# Patient Record
Sex: Male | Born: 1990 | Hispanic: Yes | Marital: Married | Smoking: Never smoker
Health system: Southern US, Community
[De-identification: ages and names within clinical notes are randomized; demographics above are authoritative.]

## PROBLEM LIST (undated history)

## (undated) DIAGNOSIS — J309 Allergic rhinitis, unspecified: Secondary | ICD-10-CM

## (undated) DIAGNOSIS — M2140 Flat foot [pes planus] (acquired), unspecified foot: Secondary | ICD-10-CM

## (undated) DIAGNOSIS — F32A Depression, unspecified: Secondary | ICD-10-CM

## (undated) DIAGNOSIS — G4733 Obstructive sleep apnea (adult) (pediatric): Secondary | ICD-10-CM

## (undated) DIAGNOSIS — F419 Anxiety disorder, unspecified: Secondary | ICD-10-CM

## (undated) DIAGNOSIS — F329 Major depressive disorder, single episode, unspecified: Secondary | ICD-10-CM

## (undated) HISTORY — PX: FINGER TENDON REPAIR: SHX1640

## (undated) HISTORY — DX: Depression, unspecified: F32.A

## (undated) HISTORY — PX: OTHER SURGICAL HISTORY: SHX169

## (undated) HISTORY — PX: PILONIDAL CYST EXCISION: SHX744

## (undated) HISTORY — DX: Anxiety disorder, unspecified: F41.9

---
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
## 2019-05-02 ENCOUNTER — Other Ambulatory Visit: Payer: Self-pay | Admitting: Internal Medicine

## 2019-05-02 DIAGNOSIS — Z20822 Contact with and (suspected) exposure to covid-19: Secondary | ICD-10-CM

## 2019-05-06 LAB — NOVEL CORONAVIRUS, NAA: SARS-CoV-2, NAA: NOT DETECTED

## 2019-09-17 ENCOUNTER — Other Ambulatory Visit: Payer: Self-pay

## 2019-09-17 DIAGNOSIS — Z20822 Contact with and (suspected) exposure to covid-19: Secondary | ICD-10-CM

## 2019-09-17 DIAGNOSIS — Z8616 Personal history of COVID-19: Secondary | ICD-10-CM

## 2019-09-17 HISTORY — DX: Personal history of COVID-19: Z86.16

## 2019-09-18 LAB — NOVEL CORONAVIRUS, NAA: SARS-CoV-2, NAA: DETECTED — AB

## 2019-09-19 ENCOUNTER — Encounter: Payer: Self-pay | Admitting: *Deleted

## 2020-02-07 ENCOUNTER — Other Ambulatory Visit: Payer: Self-pay

## 2020-02-07 ENCOUNTER — Ambulatory Visit (INDEPENDENT_AMBULATORY_CARE_PROVIDER_SITE_OTHER): Payer: 59 | Admitting: Medical

## 2020-02-07 ENCOUNTER — Encounter: Payer: Self-pay | Admitting: Medical

## 2020-02-07 VITALS — BP 131/79 | HR 90 | Ht 71.0 in | Wt 236.0 lb

## 2020-02-07 DIAGNOSIS — R0683 Snoring: Secondary | ICD-10-CM

## 2020-02-07 DIAGNOSIS — Z1152 Encounter for screening for COVID-19: Secondary | ICD-10-CM | POA: Diagnosis not present

## 2020-02-07 DIAGNOSIS — R5383 Other fatigue: Secondary | ICD-10-CM

## 2020-02-07 DIAGNOSIS — Z0000 Encounter for general adult medical examination without abnormal findings: Secondary | ICD-10-CM

## 2020-02-07 DIAGNOSIS — Z113 Encounter for screening for infections with a predominantly sexual mode of transmission: Secondary | ICD-10-CM

## 2020-02-07 DIAGNOSIS — Z789 Other specified health status: Secondary | ICD-10-CM

## 2020-02-07 NOTE — Patient Instructions (Addendum)
For you wellness exam today I have ordered cbc, cmp, tsh, lipid panel and hiv. Future labs to be done fasting.  Will get igg covid study since had covid in December.  Recommend exercise and healthy diet.  We will let you know lab results as they come in.  Follow up date appointment will be determined after lab review.   Will refer to pulmonologist for snoring.   For depression history let me know if you have recurrence/need meds.  Also recommend cutting back on alcohol use or spread out use. 1-2 beers when drinking as compared to binging. Better to stop completely.   Preventive Care 28-13 Years Old, Male Preventive care refers to lifestyle choices and visits with your health care provider that can promote health and wellness. This includes:  A yearly physical exam. This is also called an annual well check.  Regular dental and eye exams.  Immunizations.  Screening for certain conditions.  Healthy lifestyle choices, such as eating a healthy diet, getting regular exercise, not using drugs or products that contain nicotine and tobacco, and limiting alcohol use. What can I expect for my preventive care visit? Physical exam Your health care provider will check:  Height and weight. These may be used to calculate body mass index (BMI), which is a measurement that tells if you are at a healthy weight.  Heart rate and blood pressure.  Your skin for abnormal spots. Counseling Your health care provider may ask you questions about:  Alcohol, tobacco, and drug use.  Emotional well-being.  Home and relationship well-being.  Sexual activity.  Eating habits.  Work and work Statistician. What immunizations do I need?  Influenza (flu) vaccine  This is recommended every year. Tetanus, diphtheria, and pertussis (Tdap) vaccine  You may need a Td booster every 10 years. Varicella (chickenpox) vaccine  You may need this vaccine if you have not already been vaccinated. Human  papillomavirus (HPV) vaccine  If recommended by your health care provider, you may need three doses over 6 months. Measles, mumps, and rubella (MMR) vaccine  You may need at least one dose of MMR. You may also need a second dose. Meningococcal conjugate (MenACWY) vaccine  One dose is recommended if you are 28-56 years old and a Market researcher living in a residence hall, or if you have one of several medical conditions. You may also need additional booster doses. Pneumococcal conjugate (PCV13) vaccine  You may need this if you have certain conditions and were not previously vaccinated. Pneumococcal polysaccharide (PPSV23) vaccine  You may need one or two doses if you smoke cigarettes or if you have certain conditions. Hepatitis A vaccine  You may need this if you have certain conditions or if you travel or work in places where you may be exposed to hepatitis A. Hepatitis B vaccine  You may need this if you have certain conditions or if you travel or work in places where you may be exposed to hepatitis B. Haemophilus influenzae type b (Hib) vaccine  You may need this if you have certain risk factors. You may receive vaccines as individual doses or as more than one vaccine together in one shot (combination vaccines). Talk with your health care provider about the risks and benefits of combination vaccines. What tests do I need? Blood tests  Lipid and cholesterol levels. These may be checked every 5 years starting at age 28.  Hepatitis C test.  Hepatitis B test. Screening   Diabetes screening. This is done by  checking your blood sugar (glucose) after you have not eaten for a while (fasting).  Sexually transmitted disease (STD) testing. Talk with your health care provider about your test results, treatment options, and if necessary, the need for more tests. Follow these instructions at home: Eating and drinking   Eat a diet that includes fresh fruits and vegetables,  whole grains, lean protein, and low-fat dairy products.  Take vitamin and mineral supplements as recommended by your health care provider.  Do not drink alcohol if your health care provider tells you not to drink.  If you drink alcohol: ? Limit how much you have to 0-2 drinks a day. ? Be aware of how much alcohol is in your drink. In the [place] one drink equals one 12 oz bottle of beer (355 mL), one 5 oz glass of wine (148 mL), or one 1 oz glass of hard liquor (44 mL). Lifestyle  Take daily care of your teeth and gums.  Stay active. Exercise for at least 30 minutes on 5 or more days each week.  Do not use any products that contain nicotine or tobacco, such as cigarettes, e-cigarettes, and chewing tobacco. If you need help quitting, ask your health care provider.  If you are sexually active, practice safe sex. Use a condom or other form of protection to prevent STIs (sexually transmitted infections). What's next?  Go to your health care provider once a year for a well check visit.  Ask your health care provider how often you should have your eyes and teeth checked.  Stay up to date on all vaccines. This information is not intended to replace advice given to you by your health care provider. Make sure you discuss any questions you have with your health care provider. Document Revised: 09/20/2018 Document Reviewed: 09/20/2018 Elsevier Patient Education  2020 [employer].

## 2020-02-07 NOTE — Progress Notes (Signed)
Subjective:    Patient ID: Chad Long, male    DOB: 08/09/1991, 29 y.o.   MRN: [id number]  HPI  Pt in for first time.   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 Not exercising on regular basis. Vapes heavily. Used to chew tobacco up to 2016 the quit. 12 pack of beer on weekends. Occasional coffee. Married- one child.  No chronic medical problems.  Pt states some anxiety and depression when he was in [place]. He was given medication but did not take. He thinks may have been lexapro. Pt states mood is better now.    Review of Systems  Constitutional: Positive for fatigue. Negative for chills and fever.  HENT: Negative for congestion, ear discharge, ear pain, facial swelling, postnasal drip, sinus pressure and sore throat.   Respiratory: Negative for cough, chest tightness, shortness of breath and wheezing.        Snores since teenage. Wife concerned. Pt wants evaluation for sleep apnea.  Cardiovascular: Negative for chest pain and palpitations.  Gastrointestinal: Negative for abdominal pain.  Musculoskeletal: Negative for back pain.  Skin: Negative for rash.  Neurological: Negative for dizziness, speech difficulty, weakness, numbness and headaches.  Hematological: Negative for adenopathy. Does not bruise/bleed easily.  Psychiatric/Behavioral: Negative for behavioral problems, confusion and dysphoric mood. The patient is not nervous/anxious.        States mood is good now.    Past Medical History:  Diagnosis Date  . Anxiety   . Depression      Social History   Socioeconomic History  . Marital status: Married    Spouse name: Not on file  . Number of children: Not on file  . Years of education: Not on file  . Highest education level: Not on file  Occupational History  . Occupation: Curator.  Tobacco Use  . Smoking status: Never Smoker  . Smokeless tobacco: Former Systems developer    Types: Chew  Substance and Sexual Activity  . Alcohol use: Yes   Alcohol/week: 12.0 standard drinks    Types: 12 Cans of beer per week    Comment: over the weekend.  . Drug use: Not on file  . Sexual activity: Yes  Other Topics Concern  . Not on file  Social History Narrative  . Not on file   Social Determinants of Health   Financial Resource Strain:   . Difficulty of Paying Living Expenses:   Food Insecurity:   . Worried About Charity fundraiser in the Last Year:   . Arboriculturist in the Last Year:   Transportation Needs:   . Film/video editor (Medical):   Marland Kitchen Lack of Transportation (Non-Medical):   Physical Activity:   . Days of Exercise per Week:   . Minutes of Exercise per Session:   Stress:   . Feeling of Stress :   Social Connections:   . Frequency of Communication with Friends and Family:   . Frequency of Social Gatherings with Friends and Family:   . Attends Religious Services:   . Active Member of Clubs or Organizations:   . Attends Archivist Meetings:   Marland Kitchen Marital Status:   Intimate Partner Violence:   . Fear of Current or Ex-Partner:   . Emotionally Abused:   Marland Kitchen Physically Abused:   . Sexually Abused:     Past Surgical History:  Procedure Laterality Date  . FINGER TENDON REPAIR     left index finger in 2010.  Marland Kitchen  pilonydal cyst removal.     before he entered [employer].    History reviewed. No pertinent family history.  Not on File  No current outpatient medications on file prior to visit.   No current facility-administered medications on file prior to visit.    BP 131/79 (BP Location: Left Arm, Patient Position: Sitting, Cuff Size: Large)   Pulse 90   Ht 5\' 11"  (1.803 m)   Wt 236 lb (107 kg)   SpO2 98%   BMI 32.92 kg/m       Objective:   Physical Exam  General Mental Status- Alert. General Appearance- Not in acute distress.   Skin General: Color- Normal Color. Moisture- Normal Moisture.  Neck Carotid Arteries- Normal color. Moisture- Normal Moisture. No JVD.  Chest and Lung  Exam Auscultation: Breath Sounds:-Normal.  Cardiovascular Auscultation:Rythm- Regular. Murmurs & Other Heart Sounds:Auscultation of the heart reveals- No Murmurs.  Abdomen Inspection:-Inspeection Normal. Palpation/Percussion:Note:No mass. Palpation and Percussion of the abdomen reveal- Non Tender, Non Distended + BS, no rebound or guarding.    Neurologic Cranial Nerve exam:- CN III-XII intact(No nystagmus), symmetric smile. Drift Test:- No drift. Romberg Exam:- Negative.  Heal to Toe Gait exam:-Normal. Finger to Nose:- Normal/Intact Strength:- 5/5 equal and symmetric strength both upper and lower extremities.      Assessment & Plan:  For you wellness exam today I have ordered cbc, cmp, tsh, lipid panel and hiv. Future labs to be done fasting.  Will get igg covid study since had covid in December.  Recommend exercise and healthy diet.  We will let you know lab results as they come in.  Follow up date appointment will be determined after lab review.   Will refer to pulmonologist for snoring.   For depression history let me know if you have recurrence/need meds.  Also recommend cutting back on alcohol use or spread out use. 1-2 beers when drinking as compared to binging.     January, PA-C

## 2020-02-12 ENCOUNTER — Other Ambulatory Visit (INDEPENDENT_AMBULATORY_CARE_PROVIDER_SITE_OTHER): Payer: 59

## 2020-02-12 ENCOUNTER — Encounter: Payer: Self-pay | Admitting: Medical

## 2020-02-12 ENCOUNTER — Other Ambulatory Visit: Payer: Self-pay

## 2020-02-12 DIAGNOSIS — R5383 Other fatigue: Secondary | ICD-10-CM

## 2020-02-12 DIAGNOSIS — Z1152 Encounter for screening for COVID-19: Secondary | ICD-10-CM

## 2020-02-12 DIAGNOSIS — Z0000 Encounter for general adult medical examination without abnormal findings: Secondary | ICD-10-CM | POA: Diagnosis not present

## 2020-02-12 DIAGNOSIS — Z113 Encounter for screening for infections with a predominantly sexual mode of transmission: Secondary | ICD-10-CM

## 2020-02-12 LAB — LIPID PANEL
Cholesterol: 149 mg/dL (ref 0–200)
HDL: 43.7 mg/dL (ref >39.00)
LDL Cholesterol: 76 mg/dL (ref 0–99)
NonHDL: 105
Total CHOL/HDL Ratio: 3
Triglycerides: 145 mg/dL (ref 0.0–149.0)
VLDL: 29 mg/dL (ref 0.0–40.0)

## 2020-02-12 LAB — COMPREHENSIVE METABOLIC PANEL
ALT: 32 U/L (ref 0–53)
AST: 24 U/L (ref 0–37)
Albumin: 4.4 g/dL (ref 3.5–5.2)
Alkaline Phosphatase: 60 U/L (ref 39–117)
BUN: 19 mg/dL (ref 6–23)
CO2: 28 mEq/L (ref 19–32)
Calcium: 9.4 mg/dL (ref 8.4–10.5)
Chloride: 103 mEq/L (ref 96–112)
Creatinine, Ser: 1.09 mg/dL (ref 0.40–1.50)
GFR: 80.89 mL/min (ref >60.00)
Glucose, Bld: 96 mg/dL (ref 70–99)
Potassium: 4.2 mEq/L (ref 3.5–5.1)
Sodium: 137 mEq/L (ref 135–145)
Total Bilirubin: 0.7 mg/dL (ref 0.2–1.2)
Total Protein: 7.1 g/dL (ref 6.0–8.3)

## 2020-02-12 LAB — CBC WITH DIFFERENTIAL/PLATELET
Basophils Absolute: 0.1 10*3/uL (ref 0.0–0.1)
Basophils Relative: 0.8 % (ref 0.0–3.0)
Eosinophils Absolute: 0.5 10*3/uL (ref 0.0–0.7)
Eosinophils Relative: 7.4 % — ABNORMAL HIGH (ref 0.0–5.0)
HCT: 45.2 % (ref 39.0–52.0)
Hemoglobin: 15.7 g/dL (ref 13.0–17.0)
Lymphocytes Relative: 40.6 % (ref 12.0–46.0)
Lymphs Abs: 2.8 10*3/uL (ref 0.7–4.0)
MCHC: 34.7 g/dL (ref 30.0–36.0)
MCV: 88.2 fl (ref 78.0–100.0)
Monocytes Absolute: 0.5 10*3/uL (ref 0.1–1.0)
Monocytes Relative: 7.4 % (ref 3.0–12.0)
Neutro Abs: 3 10*3/uL (ref 1.4–7.7)
Neutrophils Relative %: 43.8 % (ref 43.0–77.0)
Platelets: 224 10*3/uL (ref 150.0–400.0)
RBC: 5.12 Mil/uL (ref 4.22–5.81)
RDW: 13.4 % (ref 11.5–15.5)
WBC: 7 10*3/uL (ref 4.0–10.5)

## 2020-02-12 LAB — TSH: TSH: 2.44 u[IU]/mL (ref 0.35–4.50)

## 2020-02-13 ENCOUNTER — Ambulatory Visit (INDEPENDENT_AMBULATORY_CARE_PROVIDER_SITE_OTHER): Payer: 59

## 2020-02-13 ENCOUNTER — Encounter: Payer: Self-pay | Admitting: Podiatry

## 2020-02-13 ENCOUNTER — Ambulatory Visit: Payer: 59 | Admitting: Podiatry

## 2020-02-13 ENCOUNTER — Other Ambulatory Visit: Payer: Self-pay | Admitting: Podiatry

## 2020-02-13 VITALS — Temp 97.0°F

## 2020-02-13 DIAGNOSIS — M79672 Pain in left foot: Secondary | ICD-10-CM | POA: Diagnosis not present

## 2020-02-13 DIAGNOSIS — M76821 Posterior tibial tendinitis, right leg: Secondary | ICD-10-CM

## 2020-02-13 DIAGNOSIS — M76822 Posterior tibial tendinitis, left leg: Secondary | ICD-10-CM

## 2020-02-13 DIAGNOSIS — M79671 Pain in right foot: Secondary | ICD-10-CM | POA: Diagnosis not present

## 2020-02-13 LAB — HIV ANTIBODY (ROUTINE TESTING W REFLEX): HIV 1&2 Ab, 4th Generation: NONREACTIVE

## 2020-02-13 MED ORDER — DICLOFENAC SODIUM 75 MG PO TBEC: 75.0000 mg | DELAYED_RELEASE_TABLET | Freq: Two times a day (BID) | ORAL | 2 refills | Status: DC

## 2020-02-13 NOTE — Progress Notes (Signed)
Subjective:   Patient ID: Chad Long, male   DOB: 29 y.o.   MRN: [id number]   HPI Patient presents with pain around the ankles of both feet and states this is been going on for years and he did have orthotics and they did not make a big difference in the past.  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  Patient does not smoke and would like to be more active   Review of Systems  All other systems reviewed and are negative.       Objective:  Physical Exam Vitals and nursing note reviewed.  Constitutional:      Appearance: He is well-developed.  Pulmonary:     Effort: Pulmonary effort is normal.  Musculoskeletal:        General: Normal range of motion.  Skin:    General: Skin is warm.  Neurological:     Mental Status: He is alert.     Neurovascular status intact muscle strength was found to be adequate with flatfoot deformity noted bilateral with mild diminishment of range of motion.  There is quite a bit of inflammation in the posterior tibial insertion into the navicular bilateral and it is painful when palpated.  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  Today I did sterile prep and I injected the posterior tibial tendon at its insertion 3 mg Dexasone Kenalog 5 mg Xylocaine and applied fascial braces to lift up the arch and placed on oral anti-inflammatories.  I discussed a change in his orthotics he will bring in his old orthotics for [place] to look at and we will decide what else we can do to try to help him.  Patient understands this is a difficult problem but I am not advocating surgery at the current time  X-rays indicate there is depression of the arch bilateral with no indications of accessory navicular or pathology from that perspective

## 2020-02-14 LAB — CERULOPLASMIN: Ceruloplasmin: 28.8 mg/dL (ref 16.0–31.0)

## 2020-02-14 LAB — HEPATITIS B SURFACE ANTIBODY,QUALITATIVE: Hep B Surface Ab, Qual: NONREACTIVE

## 2020-02-14 LAB — ANA W/REFLEX: Anti Nuclear Antibody (ANA): NEGATIVE

## 2020-02-14 LAB — HEPATITIS A (PROF V)
Hep A IgM: NEGATIVE
hep A Total Ab: NEGATIVE

## 2020-02-14 LAB — ALPHA-1-ANTITRYPSIN: A-1 Antitrypsin: 159 mg/dL (ref 95–164)

## 2020-02-14 LAB — SAR COV2 SEROLOGY (COVID19)AB(IGG),IA: DiaSorin SARS-CoV-2 Ab, IgG: POSITIVE

## 2020-02-14 LAB — MITOCHONDRIAL ANTIBODIES: Mitochondrial Ab: <20 Units (ref 0.0–20.0)

## 2020-02-14 LAB — ANTI-SMOOTH MUSCLE ANTIBODY, IGG: Smooth Muscle Ab: 10 Units (ref 0–19)

## 2020-03-05 ENCOUNTER — Ambulatory Visit: Payer: 59 | Admitting: Podiatry

## 2020-03-25 ENCOUNTER — Ambulatory Visit: Payer: 59 | Admitting: Podiatry

## 2020-05-01 ENCOUNTER — Ambulatory Visit (INDEPENDENT_AMBULATORY_CARE_PROVIDER_SITE_OTHER): Payer: 59

## 2020-05-01 ENCOUNTER — Ambulatory Visit: Payer: 59 | Admitting: Podiatry

## 2020-05-01 ENCOUNTER — Other Ambulatory Visit: Payer: Self-pay

## 2020-05-01 ENCOUNTER — Encounter: Payer: Self-pay | Admitting: Podiatry

## 2020-05-01 DIAGNOSIS — M25472 Effusion, left ankle: Secondary | ICD-10-CM

## 2020-05-01 DIAGNOSIS — M25572 Pain in left ankle and joints of left foot: Secondary | ICD-10-CM

## 2020-05-01 DIAGNOSIS — M109 Gout, unspecified: Secondary | ICD-10-CM

## 2020-05-01 MED ORDER — MELOXICAM 15 MG PO TABS
15.0000 mg | ORAL_TABLET | Freq: Every day | ORAL | 0 refills | Status: DC
Start: 2020-05-01 — End: 2020-11-17

## 2020-05-01 NOTE — Progress Notes (Signed)
Subjective:  Patient ID: Chad Long, male    DOB: 10/02/1991,  MRN: [id number]  Chief Complaint  Patient presents with  . Ankle Pain    L ankle - anterior and lateral. x2 months. Pt stated, "Pain is 7-8/10 most of the time - sometimes I'd say 10/10. No injury. Nothing helps. I've tried Biofreeze, ice, and elevation".    29 y.o. male presents with the above complaint. History confirmed with patient.  Pain is quite severe and pretty much touching it hurts.  He has very sharp pains with each step.  He previously saw Dr. Charlsie Merles for flatfeet.  He had injected his posterior tibial tendon which helped some and arch pain.  This pain is much more high in the ankle and surrounds the entirety of the ankle.  So far no treatment is helped.  Denies any history of trauma  Objective:  Physical Exam: warm, good capillary refill, no trophic changes or ulcerative lesions, normal DP and PT pulses and normal sensory exam. Left Foot: Pain and edema of the left ankle surrounding the anterior joint line, he is also tender in the posterior ankle joint and in the medial and lateral gutters.  Pain with dorsiflexion.  Pain with subtalar joint range of motion.  He has pain over the peroneals and the posterior tibial tendon as well.  No pain in the Achilles tendon.  Has full active range of motion.  No cellulitis noted.  Warmth around the ankle joint.  Radiographs: X-ray of the left ankle joint: Mild pes planus is noted, ankle joint is maintained alignment and joint space.  Large os trigonum is present posteriorly Assessment:   1. Acute gout of left ankle, unspecified cause   2. Acute left ankle pain   3. Swelling of ankle joint, left      Plan:  Patient was evaluated and treated and all questions answered.   -29 year old male with acute ankle pain slow progressive onset over the last month.  Continuously getting worse and it is painful to walk on or touch.  Symptoms potentially could be a onset of acute gout.  He  does drink alcohol and eats red meat, although he says that he has been trying to eat more salads recently.  I recommended that we diagnosis with laboratory work, and joint aspiration.  See procedure note below. -Recommend we support immobilized in a low top CAM boot -We will also order an MRI of the left ankle to evaluate the surrounding tendons and ligaments which are painful, he did not have any instability on exam today. -This could be also secondary to his flatfoot, although he was painful all around the ankle medially and posterior, and not just over the sinus tarsi.  At next visit if the injection below did not improve his pain in the ankle joint, we will consider the subtalar joint.  No evidence of tarsal coalition.  Procedure note: 2 cc of Carbocaine plain was used to anesthetize the anteromedial ankle in the area of the saphenous nerve after sterile prep with alcohol.  Once this was anesthetized, a Betadine prep was performed over the anteromedial ankle over the medial gutter, 1 cc of Carbocaine was then injected directly into the ankle joint via an 18-gauge needle on a 10 cc syringe.  Approximately 2 cc of straw-colored and bloody aspirate was removed from the left ankle joint.  This was sent for culture, cell count, and crystal analysis.  1 cc of Carbocaine and 4 mg of dexamethasone phosphate was then  injected on the left ankle.  He tolerated the procedure well.  Sharl Ma, DPM 05/01/2020     No follow-ups on file.

## 2020-05-04 ENCOUNTER — Telehealth: Payer: Self-pay | Admitting: *Deleted

## 2020-05-04 ENCOUNTER — Other Ambulatory Visit: Payer: Self-pay | Admitting: Podiatry

## 2020-05-04 DIAGNOSIS — M109 Gout, unspecified: Secondary | ICD-10-CM

## 2020-05-04 DIAGNOSIS — M76822 Posterior tibial tendinitis, left leg: Secondary | ICD-10-CM

## 2020-05-04 DIAGNOSIS — M25472 Effusion, left ankle: Secondary | ICD-10-CM

## 2020-05-04 DIAGNOSIS — M25572 Pain in left ankle and joints of left foot: Secondary | ICD-10-CM

## 2020-05-04 NOTE — Telephone Encounter (Signed)
-----   Message from Edwin Cap, DPM sent at 05/01/2020  5:22 PM EDT ----- Regarding: MRI Hey Val, can you order a non contrast MRI of the left ankle for Chad Long? Dx is acute left ankle pain, left ankle swelling. X-ray negative if that can be specified in the comments.   Thanks! Madelaine Bhat

## 2020-05-04 NOTE — Telephone Encounter (Signed)
Orders to Dr. Vara Guardian CMA for pre-cert.

## 2020-05-07 LAB — SYNOVIAL CELL COUNT + DIFF, W/ CRYSTALS
Basophils, %: 0 %
Eosinophils-Synovial: 0 % (ref 0–2)
Lymphocytes-Synovial Fld: 82 % — ABNORMAL HIGH (ref 0–74)
Monocyte/Macrophage: 12 % (ref 0–69)
Neutrophil, Synovial: 6 % (ref 0–24)
Synoviocytes, %: 0 % (ref 0–15)
WBC, Synovial: 215 cells/uL — ABNORMAL HIGH (ref <150)

## 2020-05-07 LAB — ANAEROBIC AND AEROBIC CULTURE
AER RESULT:: NO GROWTH
MICRO NUMBER:: 10743640
MICRO NUMBER:: 10743641
SPECIMEN QUALITY:: ADEQUATE
SPECIMEN QUALITY:: ADEQUATE

## 2020-05-13 ENCOUNTER — Telehealth: Payer: Self-pay | Admitting: *Deleted

## 2020-05-13 NOTE — Telephone Encounter (Signed)
Faxed patient's information to [facility] to get a prior authorization done on 05/13/20

## 2020-05-15 ENCOUNTER — Other Ambulatory Visit: Payer: Self-pay

## 2020-05-15 ENCOUNTER — Ambulatory Visit: Payer: 59

## 2020-05-15 DIAGNOSIS — Z20822 Contact with and (suspected) exposure to covid-19: Secondary | ICD-10-CM

## 2020-05-16 LAB — SARS-COV-2, NAA 2 DAY TAT

## 2020-05-16 LAB — NOVEL CORONAVIRUS, NAA: SARS-CoV-2, NAA: NOT DETECTED

## 2020-05-22 ENCOUNTER — Other Ambulatory Visit: Payer: Self-pay

## 2020-05-22 ENCOUNTER — Ambulatory Visit (INDEPENDENT_AMBULATORY_CARE_PROVIDER_SITE_OTHER): Payer: 59 | Admitting: Podiatry

## 2020-05-22 DIAGNOSIS — M25472 Effusion, left ankle: Secondary | ICD-10-CM

## 2020-05-22 DIAGNOSIS — M25572 Pain in left ankle and joints of left foot: Secondary | ICD-10-CM | POA: Diagnosis not present

## 2020-05-22 DIAGNOSIS — M76821 Posterior tibial tendinitis, right leg: Secondary | ICD-10-CM

## 2020-05-22 DIAGNOSIS — M79672 Pain in left foot: Secondary | ICD-10-CM

## 2020-05-22 DIAGNOSIS — M79671 Pain in right foot: Secondary | ICD-10-CM

## 2020-05-22 DIAGNOSIS — M2141 Flat foot [pes planus] (acquired), right foot: Secondary | ICD-10-CM

## 2020-05-22 DIAGNOSIS — M2142 Flat foot [pes planus] (acquired), left foot: Secondary | ICD-10-CM

## 2020-05-22 DIAGNOSIS — M76822 Posterior tibial tendinitis, left leg: Secondary | ICD-10-CM

## 2020-05-24 NOTE — Progress Notes (Signed)
  Subjective:  Patient ID: Chad Long, male    DOB: 1991/01/30,  MRN: [id number]  Chief Complaint  Patient presents with  . Follow-up    L ankle. Pt stated, "It's getting better. I start having pain when I walk for awhile. I have not received a call regarding the MRI". Pt was unable to rate his pain using the pain scale.     29 y.o. male presents with the above complaint. History confirmed with patient. Pain in the ankle is better than it was after the injection. He still has pain on the outside of the foot near the distal fibula. Did not have the lab work completed and has not had the MRI yet.  Objective:  Physical Exam: warm, good capillary refill, no trophic changes or ulcerative lesions, normal DP and PT pulses and normal sensory exam. Left Foot: Pain with ankle joint range of motion is much improved and no longer has pain on the medial joint line. He still has pain with subtalar joint range of motion. Pain with palpation of the sinus tarsi. He has pain over the peroneals and the posterior tibial tendon as well.  No pain in the Achilles tendon.  Has full active range of motion.      Ankle aspirate from last visit is negative for gout, pseudogout or septic arthritis Assessment:   1. Acute left ankle pain   2. Swelling of ankle joint, left   3. Pes planus of both feet   4. Posterior tibialis tendinitis of both lower extremities   5. Sinus tarsi syndrome, left   6. Bilateral foot pain      Plan:  Patient was evaluated and treated and all questions answered.   - today his pain somewhat improved, however he does still have pain in the subtalar joint and the medial and lateral foot. His ankle joint tap last visit was negative for gout, pseudogout or septic arthritis. It appears now the most of his pathology is centered around the subtalar joint. I think this is all secondary to his pes planus deformity at this point. I recommended a diagnostic and therapeutic block/injection of the  subtalar joint today. -Reviewed supportive shoe gears including orthotic support. -MRI is now scheduled for later this month -Pending MRI results will discuss further with him surgical and nonsurgical treatment for flatfoot deformity and pain.   Procedure: Injection Intermediate Joint Consent: Verbal consent obtained. Location: Left subtalar joint  Skin Prep: Betadine and alcohol. Injectate: 1 cc 2% Xylocaine plain, 1 cc dexamethasone phosphate Disposition: Patient tolerated procedure well. Injection site dressed with a band-aid.   Return in about 1 month (around 06/22/2020).

## 2020-05-30 ENCOUNTER — Other Ambulatory Visit: Payer: Self-pay

## 2020-05-30 ENCOUNTER — Ambulatory Visit (HOSPITAL_BASED_OUTPATIENT_CLINIC_OR_DEPARTMENT_OTHER)
Admission: RE | Admit: 2020-05-30 | Discharge: 2020-05-30 | Disposition: A | Discharge status: Home / Self Care | Payer: 59 | Source: Ambulatory Visit | Attending: Podiatry | Admitting: Podiatry

## 2020-05-30 DIAGNOSIS — M76822 Posterior tibial tendinitis, left leg: Secondary | ICD-10-CM | POA: Diagnosis present

## 2020-05-30 DIAGNOSIS — M109 Gout, unspecified: Secondary | ICD-10-CM | POA: Diagnosis present

## 2020-05-30 DIAGNOSIS — M25572 Pain in left ankle and joints of left foot: Secondary | ICD-10-CM | POA: Insufficient documentation

## 2020-05-30 DIAGNOSIS — M25472 Effusion, left ankle: Secondary | ICD-10-CM | POA: Diagnosis present

## 2020-05-30 DIAGNOSIS — M76821 Posterior tibial tendinitis, right leg: Secondary | ICD-10-CM | POA: Diagnosis present

## 2020-06-25 ENCOUNTER — Ambulatory Visit (INDEPENDENT_AMBULATORY_CARE_PROVIDER_SITE_OTHER): Payer: 59 | Admitting: Podiatry

## 2020-06-25 ENCOUNTER — Encounter: Payer: Self-pay | Admitting: Podiatry

## 2020-06-25 ENCOUNTER — Other Ambulatory Visit: Payer: Self-pay

## 2020-06-25 DIAGNOSIS — M87076 Idiopathic aseptic necrosis of unspecified foot: Secondary | ICD-10-CM

## 2020-06-25 DIAGNOSIS — M2142 Flat foot [pes planus] (acquired), left foot: Secondary | ICD-10-CM

## 2020-06-25 DIAGNOSIS — R937 Abnormal findings on diagnostic imaging of other parts of musculoskeletal system: Secondary | ICD-10-CM

## 2020-06-25 DIAGNOSIS — M25572 Pain in left ankle and joints of left foot: Secondary | ICD-10-CM | POA: Diagnosis not present

## 2020-06-25 NOTE — Progress Notes (Signed)
Subjective:  Patient ID: Chad Long, male    DOB: 25-Aug-1991,  MRN: [id number]  Chief Complaint  Patient presents with  . Foot Pain     left ankle pain follow up    29 y.o. male returns with the above complaint. History confirmed with patient. P he completed his MRI and is here for review.  The pain is similar to last visit and did not help after the last injection. Objective:  Physical Exam: warm, good capillary refill, no trophic changes or ulcerative lesions, normal DP and PT pulses and normal sensory exam.  Left Foot: He still has pain with subtalar joint range of motion. Pain with palpation of the sinus tarsi. He has pain over the peroneals and the posterior tibial tendon as well.  No pain in the Achilles tendon.  Has full active range of motion.    MRI left Foot and Ankle 05/30/20: Study Result  Narrative & Impression  CLINICAL DATA:  Diffuse left ankle pain, swelling, limited range of motion and difficulty weight-bearing for 3 months. No known injury.  EXAM: MRI OF THE LEFT ANKLE WITHOUT CONTRAST  TECHNIQUE: Multiplanar, multisequence MR imaging of the ankle was performed. No intravenous contrast was administered.  COMPARISON:  Plain films left ankle 05/01/2020. Plain films left foot 02/13/2020.  FINDINGS: TENDONS  Peroneal: Intact.  Posteromedial: Intact.  Anterior: Intact.  Achilles: Intact.  Plantar Fascia: Normal.  LIGAMENTS  Lateral: Intact.  Medial: Intact.  CARTILAGE  Ankle Joint: No osteochondral lesion of the talar dome or joint effusion.  Subtalar Joints/Sinus Tarsi: There is marked edema throughout the sinus tarsi with obliteration of normal fat signal. Edema and cystic change are seen in both the roof and floor of the sinus tarsi. Ligaments within the sinus tarsi appear intact.  Bones: As described above. No fracture or stress change is identified. No tarsal coalition is seen.  Other:  None.  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 The exam is otherwise negative.   Electronically Signed   By: Drusilla Kanner M.D.   On: 05/30/2020 17:55    Assessment:   1. Bone marrow edema   2. Sinus tarsi syndrome of left ankle   3. Acquired pes planus, left   4. 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   Reviewed the MRI images in detail with the patient.  Discussed with him that he has a sinus tarsi syndrome as well as early degenerative changes in the subtalar joint with bone marrow edema and potential cystic changes.  This began approximately 4 months ago in the absence of traumatic injury.  I believe most of the changes are secondary to his pes planus.  I recommended that he stay off the foot as much as possible and be immobilized in a CAM boot.    I believe he could benefit from bone marrow stimulation to improve his symptoms as well and prevent avascular osteonecrosis of the talar neck and body and eventual subtalar joint arthritis now that he has failed nonsurgical treatment for almost 4 months now (he first underwent subtalar joint injection in early May of this year).    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  We discussed the potential complications can arise if this continues to worsen for him including need for subtalar joint arthrodesis.  Return in about 6 weeks (around 08/06/2020).

## 2020-06-25 NOTE — Patient Instructions (Signed)
Wear the boot at all times when walking. Stay off the left foot as much as possible.

## 2020-08-06 ENCOUNTER — Ambulatory Visit (INDEPENDENT_AMBULATORY_CARE_PROVIDER_SITE_OTHER): Payer: 59

## 2020-08-06 ENCOUNTER — Ambulatory Visit (INDEPENDENT_AMBULATORY_CARE_PROVIDER_SITE_OTHER): Payer: 59 | Admitting: Podiatry

## 2020-08-06 ENCOUNTER — Other Ambulatory Visit: Payer: Self-pay

## 2020-08-06 DIAGNOSIS — M25572 Pain in left ankle and joints of left foot: Secondary | ICD-10-CM

## 2020-08-06 DIAGNOSIS — M2141 Flat foot [pes planus] (acquired), right foot: Secondary | ICD-10-CM | POA: Diagnosis not present

## 2020-08-06 DIAGNOSIS — M25571 Pain in right ankle and joints of right foot: Secondary | ICD-10-CM

## 2020-08-06 DIAGNOSIS — Q6689 Other  specified congenital deformities of feet: Secondary | ICD-10-CM

## 2020-08-06 DIAGNOSIS — M19072 Primary osteoarthritis, left ankle and foot: Secondary | ICD-10-CM

## 2020-08-06 DIAGNOSIS — M2142 Flat foot [pes planus] (acquired), left foot: Secondary | ICD-10-CM

## 2020-08-09 ENCOUNTER — Encounter: Payer: Self-pay | Admitting: Podiatry

## 2020-08-09 NOTE — Progress Notes (Signed)
  Subjective:  Patient ID: Chad Long, male    DOB: Jan 17, 1991,  MRN: [id number]  Chief Complaint  Patient presents with  . Foot Pain    PT stated that he has no concerns and that wearing the boot made his foot hurt more     29 y.o. male returns with the above complaint. History confirmed with patient.  He continues to have hindfoot pain.  Objective:  Physical Exam: warm, good capillary refill, no trophic changes or ulcerative lesions, normal DP and PT pulses and normal sensory exam.  Left Foot: He still has pain with subtalar joint range of motion, this is very limited on exam today. Pain with palpation of the sinus tarsi. He has pain over the peroneals and the posterior tibial tendon as well.  No pain in the Achilles tendon.     MRI left Foot and Ankle 05/30/20: Study Result  Narrative & Impression  CLINICAL DATA:  Diffuse left ankle pain, swelling, limited range of motion and difficulty weight-bearing for 3 months. No known injury.  EXAM: MRI OF THE LEFT ANKLE WITHOUT CONTRAST  TECHNIQUE: Multiplanar, multisequence MR imaging of the ankle was performed. No intravenous contrast was administered.  COMPARISON:  Plain films left ankle 05/01/2020. Plain films left foot 02/13/2020.  FINDINGS: TENDONS  Peroneal: Intact.  Posteromedial: Intact.  Anterior: Intact.  Achilles: Intact.  Plantar Fascia: Normal.  LIGAMENTS  Lateral: Intact.  Medial: Intact.  CARTILAGE  Ankle Joint: No osteochondral lesion of the talar dome or joint effusion.  Subtalar Joints/Sinus Tarsi: There is marked edema throughout the sinus tarsi with obliteration of normal fat signal. Edema and cystic change are seen in both the roof and floor of the sinus tarsi. Ligaments within the sinus tarsi appear intact.  Bones: As described above. No fracture or stress change is identified. No tarsal coalition is seen.  Other: None.  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 The exam is otherwise negative.   Electronically Signed   By: Drusilla Kanner M.D.   On: 05/30/2020 17:55   Calcaneal axial views of both feet were taken, 3 series 10 degrees apart each, no definite middle facet coalition is noted Assessment:   1. Acute bilateral ankle pain   2. Arthritis of left subtalar joint   3. Pes planus of both feet   4. Coalition, talocalcaneal      Plan:  Patient was evaluated and treated and all questions answered.   We again reviewed his MRI and symptoms clinically.  He has clinical findings and symptoms of a coalition.  He has early arthrosis of the subtalar joint, which would be uncommon for someone of his age.  I discussed this case with the reading radiologist Dr. Drusilla Kanner in the coronal views that I thought were concerning for middle facet coalition he believes may be due to angular artifact due to the slope of the middle facet.  Given the amount of arthrosis he has in his subtalar joint as well as final rule out of any tarsal coalition, I recommended we obtain a CT of the left lower extremity in order to guide surgical planning.  I believe he will need surgical correction of his flatfoot and any residual arthritis.   Return in about 1 month (around 09/06/2020).

## 2020-08-10 ENCOUNTER — Other Ambulatory Visit: Payer: Self-pay | Admitting: Podiatry

## 2020-08-10 DIAGNOSIS — Q6689 Other  specified congenital deformities of feet: Secondary | ICD-10-CM

## 2020-08-17 ENCOUNTER — Telehealth: Payer: Self-pay | Admitting: Podiatry

## 2020-08-17 DIAGNOSIS — M2141 Flat foot [pes planus] (acquired), right foot: Secondary | ICD-10-CM

## 2020-08-17 DIAGNOSIS — Q6689 Other  specified congenital deformities of feet: Secondary | ICD-10-CM

## 2020-08-17 DIAGNOSIS — M19072 Primary osteoarthritis, left ankle and foot: Secondary | ICD-10-CM

## 2020-08-17 DIAGNOSIS — M2142 Flat foot [pes planus] (acquired), left foot: Secondary | ICD-10-CM

## 2020-08-17 NOTE — Telephone Encounter (Signed)
[facility] called and stated that the order for  CT lower left extremity needs to be resent. It has to be a specific body part. Please resent order for patient

## 2020-08-17 NOTE — Addendum Note (Signed)
Addended byLilian Kapur, Sheriden Archibeque R on: 08/17/2020 12:37 PM   Modules accepted: Orders

## 2020-08-28 ENCOUNTER — Ambulatory Visit: Payer: 59 | Admitting: Podiatry

## 2020-09-02 ENCOUNTER — Ambulatory Visit
Admission: RE | Admit: 2020-09-02 | Discharge: 2020-09-02 | Disposition: A | Discharge status: Home / Self Care | Payer: 59 | Source: Ambulatory Visit | Attending: Podiatry | Admitting: Podiatry

## 2020-09-02 DIAGNOSIS — M19072 Primary osteoarthritis, left ankle and foot: Secondary | ICD-10-CM

## 2020-09-02 DIAGNOSIS — Q6689 Other  specified congenital deformities of feet: Secondary | ICD-10-CM

## 2020-09-02 DIAGNOSIS — M2141 Flat foot [pes planus] (acquired), right foot: Secondary | ICD-10-CM

## 2020-09-11 ENCOUNTER — Other Ambulatory Visit: Payer: Self-pay

## 2020-09-11 ENCOUNTER — Ambulatory Visit (INDEPENDENT_AMBULATORY_CARE_PROVIDER_SITE_OTHER): Payer: 59 | Admitting: Podiatry

## 2020-09-11 DIAGNOSIS — M76822 Posterior tibial tendinitis, left leg: Secondary | ICD-10-CM

## 2020-09-11 DIAGNOSIS — M2142 Flat foot [pes planus] (acquired), left foot: Secondary | ICD-10-CM

## 2020-09-11 DIAGNOSIS — M25572 Pain in left ankle and joints of left foot: Secondary | ICD-10-CM

## 2020-09-11 DIAGNOSIS — M76821 Posterior tibial tendinitis, right leg: Secondary | ICD-10-CM | POA: Diagnosis not present

## 2020-09-11 DIAGNOSIS — M62462 Contracture of muscle, left lower leg: Secondary | ICD-10-CM

## 2020-09-11 DIAGNOSIS — M216X2 Other acquired deformities of left foot: Secondary | ICD-10-CM | POA: Diagnosis not present

## 2020-09-11 DIAGNOSIS — M21862 Other specified acquired deformities of left lower leg: Secondary | ICD-10-CM

## 2020-09-15 NOTE — Progress Notes (Signed)
Subjective:  Patient ID: Chad Long, male    DOB: 07-01-1991,  MRN: [id number]  Chief Complaint  Patient presents with  . Foot Pain    Pt stated that he is doing ok he has no concerns but does have some foot pain and is here for CT scan results     29 y.o. male returns with the above complaint. History confirmed with patient.  Completed his CT scan  Objective:  Physical Exam: warm, good capillary refill, no trophic changes or ulcerative lesions, normal DP and PT pulses and normal sensory exam.  Left Foot:. Pain with palpation of the sinus tarsi. He has pain over the peroneals and the posterior tibial tendon as well.  No pain in the Achilles tendon.   He has pain in the mid arch.  Pes planus noted.  He is able to do a single heel rise.  CT 09/02/2020: Study Result  Narrative & Impression  CLINICAL DATA:  Chronic left foot pain  EXAM: CT OF THE LEFT FOOT WITHOUT CONTRAST  TECHNIQUE: Multidetector CT imaging of the left foot was performed according to the standard protocol. Multiplanar CT image reconstructions were also generated.  COMPARISON:  X-ray 05/01/2020.  MRI 05/30/2020  FINDINGS: Bones/Joint/Cartilage  No acute fracture. No dislocation. Ankle mortise is congruent. No talar dome osteochondral lesion. Subtalar joint spaces are maintained. Subcortical cystic changes adjacent to the middle subtalar joint. Prominent os trigonum. No tibiotalar or subtalar joint effusion is seen. Loss of fat density within the region of the sinus tarsi as previously characterized on MRI. No evidence of bony tarsal coalition.  Joint spaces of the midfoot and forefoot are maintained without significant arthropathy. No cortical thickening or periostitis.  Ligaments  Suboptimally assessed by CT.  Muscles and Tendons  Normal appearance of the tendinous structures by CT. No muscle atrophy or fatty infiltration.  Soft tissues  No soft tissue edema or fluid  collection.  IMPRESSION: 1. No acute osseous abnormality or significant degenerative changes of the left foot. 2. No evidence of tarsal coalition. 3. Loss of fat density within the region of the sinus tarsi as previously characterized on MRI. 4. Os trigonum.   Electronically Signed   By: Duanne Guess D.O.   On: 09/03/2020 13:37    MRI left Foot and Ankle 05/30/20: Study Result  Narrative & Impression  CLINICAL DATA:  Diffuse left ankle pain, swelling, limited range of motion and difficulty weight-bearing for 3 months. No known injury.  EXAM: MRI OF THE LEFT ANKLE WITHOUT CONTRAST  TECHNIQUE: Multiplanar, multisequence MR imaging of the ankle was performed. No intravenous contrast was administered.  COMPARISON:  Plain films left ankle 05/01/2020. Plain films left foot 02/13/2020.  FINDINGS: TENDONS  Peroneal: Intact.  Posteromedial: Intact.  Anterior: Intact.  Achilles: Intact.  Plantar Fascia: Normal.  LIGAMENTS  Lateral: Intact.  Medial: Intact.  CARTILAGE  Ankle Joint: No osteochondral lesion of the talar dome or joint effusion.  Subtalar Joints/Sinus Tarsi: There is marked edema throughout the sinus tarsi with obliteration of normal fat signal. Edema and cystic change are seen in both the roof and floor of the sinus tarsi. Ligaments within the sinus tarsi appear intact.  Bones: As described above. No fracture or stress change is identified. No tarsal coalition is seen.  Other: None.  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 The exam is otherwise negative.   Electronically Signed   By: Drusilla Kanner M.D.   On: 05/30/2020 17:55   Calcaneal axial views of  both feet were taken, 3 series 10 degrees apart each, no definite middle facet coalition is noted Assessment:   No diagnosis found.   Plan:  Patient was evaluated and treated and all questions answered.   Discussed with him  today that he does not have any evidence radiographically of any tarsal coalition's.  He has not improved with injections and over long periods of time, although they were helpful for a short period of time.  He has tried orthotics previously both prefabricated and custom.  These were not helpful for him.  He is interested in surgical correction.  Discussed the risk, benefits and potential complication of surgical correction of flatfoot deformity with him.  Surgically we discussed the following procedures: Strayer gastrocnemius recession, Evans calcaneal osteotomy, medial displacement calcaneal osteotomy, and a cotton osteotomy.  These to be with the use of cadaveric bone graft and metallic implants as needed.  We discussed the postoperative course and the need for non-weightbearing for up to 8 weeks.  He would like to proceed with surgical correction.  All his questions were addressed.  Informed consent was signed and reviewed.  Surgery will be scheduled at a mutually agreeable date   Surgical plan:  Procedure: -Strayer gastrocnemius recession, Evans calcaneal osteotomy, cotton osteotomy, medial displacement calcaneal osteotomy left foot and leg  Location: -[facility]  Anesthesia plan: -General anesthesia with regional block  Postoperative pain plan: - Tylenol 1000 mg every 6 hours, ibuprofen 600 mg every 6 hours, gabapentin 300 mg every 8 hours x5 days, oxycodone 5 mg 1-2 tabs every 6 hours only as needed  DVT prophylaxis: -ASA 325 mg twice daily  WB Restrictions / DME needs: -NWB in posterior splint LLE    No follow-ups on file.

## 2020-10-07 ENCOUNTER — Other Ambulatory Visit: Payer: Self-pay

## 2020-10-07 ENCOUNTER — Other Ambulatory Visit: Payer: 59

## 2020-10-07 DIAGNOSIS — Z20822 Contact with and (suspected) exposure to covid-19: Secondary | ICD-10-CM

## 2020-10-09 LAB — NOVEL CORONAVIRUS, NAA: SARS-CoV-2, NAA: NOT DETECTED

## 2020-10-09 LAB — SARS-COV-2, NAA 2 DAY TAT

## 2020-11-11 ENCOUNTER — Telehealth: Payer: Self-pay

## 2020-11-11 NOTE — Telephone Encounter (Signed)
DOS 11/20/2020  REVISION CALF TENDON LT - 90383 INCISION OF MIDFOOT BONES LT - 28304 INCISION OF HEEL BONE LT - 28300  RECEIVED FAX FROM [facility] - NO AUTH REQUIRED FOR CPT [id number], [phone] [place] [phone]. REF # [id number]

## 2020-11-16 ENCOUNTER — Ambulatory Visit: Payer: 59 | Admitting: Medical

## 2020-11-17 ENCOUNTER — Other Ambulatory Visit: Payer: Self-pay

## 2020-11-17 ENCOUNTER — Encounter (HOSPITAL_BASED_OUTPATIENT_CLINIC_OR_DEPARTMENT_OTHER): Payer: Self-pay | Admitting: Podiatry

## 2020-11-17 ENCOUNTER — Ambulatory Visit: Payer: 59 | Admitting: Medical

## 2020-11-18 ENCOUNTER — Encounter: Payer: Self-pay | Admitting: Medical

## 2020-11-18 ENCOUNTER — Ambulatory Visit (INDEPENDENT_AMBULATORY_CARE_PROVIDER_SITE_OTHER): Payer: 59 | Admitting: Medical

## 2020-11-18 VITALS — BP 139/89 | HR 93 | Temp 98.1°F | Resp 18 | Ht 71.0 in | Wt 225.0 lb

## 2020-11-18 DIAGNOSIS — M2142 Flat foot [pes planus] (acquired), left foot: Secondary | ICD-10-CM

## 2020-11-18 DIAGNOSIS — M2141 Flat foot [pes planus] (acquired), right foot: Secondary | ICD-10-CM | POA: Diagnosis not present

## 2020-11-18 DIAGNOSIS — Z01818 Encounter for other preprocedural examination: Secondary | ICD-10-CM

## 2020-11-18 LAB — COMPREHENSIVE METABOLIC PANEL
AG Ratio: 1.4 (calc) (ref 1.0–2.5)
ALT: 25 U/L (ref 9–46)
AST: 22 U/L (ref 10–40)
Albumin: 4.4 g/dL (ref 3.6–5.1)
Alkaline phosphatase (APISO): 79 U/L (ref 36–130)
BUN: 12 mg/dL (ref 7–25)
CO2: 28 mmol/L (ref 20–32)
Calcium: 9.7 mg/dL (ref 8.6–10.3)
Chloride: 105 mmol/L (ref 98–110)
Creat: 1.14 mg/dL (ref 0.60–1.35)
Globulin: 3.1 g/dL (calc) (ref 1.9–3.7)
Glucose, Bld: 93 mg/dL (ref 65–99)
Potassium: 4.1 mmol/L (ref 3.5–5.3)
Sodium: 140 mmol/L (ref 135–146)
Total Bilirubin: 0.5 mg/dL (ref 0.2–1.2)
Total Protein: 7.5 g/dL (ref 6.1–8.1)

## 2020-11-18 LAB — CBC WITH DIFFERENTIAL/PLATELET
Absolute Monocytes: 504 cells/uL (ref 200–950)
Basophils Absolute: 50 cells/uL (ref 0–200)
Basophils Relative: 0.7 %
Eosinophils Absolute: 959 cells/uL — ABNORMAL HIGH (ref 15–500)
Eosinophils Relative: 13.5 %
HCT: 45.9 % (ref 38.5–50.0)
Hemoglobin: 15.7 g/dL (ref 13.2–17.1)
Lymphs Abs: 2528 cells/uL (ref 850–3900)
MCH: 30 pg (ref 27.0–33.0)
MCHC: 34.2 g/dL (ref 32.0–36.0)
MCV: 87.6 fL (ref 80.0–100.0)
MPV: 9.4 fL (ref 7.5–12.5)
Monocytes Relative: 7.1 %
Neutro Abs: 3060 cells/uL (ref 1500–7800)
Neutrophils Relative %: 43.1 %
Platelets: 247 10*3/uL (ref 140–400)
RBC: 5.24 10*6/uL (ref 4.20–5.80)
RDW: 13 % (ref 11.0–15.0)
Total Lymphocyte: 35.6 %
WBC: 7.1 10*3/uL (ref 3.8–10.8)

## 2020-11-18 NOTE — Progress Notes (Signed)
Subjective:    Patient ID: Chad Long, male    DOB: 04-19-1991, 30 y.o.   MRN: [id number]  HPI Pt has hx of flat feet. Pt states feet always bothered him. He states when in [employer] PT feet would hurt.  Pt is scheduled to have surgery for foot reconstruction.   No family history of sudden death. General anesthesia twice with no adverse reaction. Tendon laceration and large cyst in upper buttock. No cardiac or respiratory condition. No easy bruising.   FH- Dad- diabetes. Mom- alive and well. 1 brother and sister alive and well.       Review of Systems  Constitutional: Negative for chills, fatigue and fever.  HENT: Negative for congestion.   Respiratory: Negative for cough, chest tightness, shortness of breath and wheezing.   Cardiovascular: Negative for chest pain and palpitations.  Gastrointestinal: Negative for abdominal pain, blood in stool, diarrhea and vomiting.  Genitourinary: Negative for difficulty urinating, flank pain and frequency.  Musculoskeletal: Negative for back pain.  Skin: Positive for rash. Negative for pallor.  Neurological: Negative for dizziness, speech difficulty, weakness, numbness and headaches.  Hematological: Negative for adenopathy. Does not bruise/bleed easily.  Psychiatric/Behavioral: Negative for behavioral problems and confusion. The patient is not nervous/anxious.    Past Medical History:  Diagnosis Date  . Anxiety   . Depression   . Flat foot   . History of 2019 novel coronavirus disease (COVID-19) 09/17/2019   positive test result in epic, per pt had mild symptoms that resolved  . OSA (obstructive sleep apnea)    followed by pulmonologist---Llindsey Tyron Russell NP---  pt study in epic 06-22-2020 mild osa and pt titrated for cpap,  on 11-17-2020 pt stated his cpap is on order and have not received yet     Social History   Socioeconomic History  . Marital status: Married    Spouse name: Not on file  . Number of children: Not on file  .  Years of education: Not on file  . Highest education level: Not on file  Occupational History  . Occupation: Education administrator.  Tobacco Use  . Smoking status: Never Smoker  . Smokeless tobacco: Former Neurosurgeon    Types: Engineer, drilling  . Vaping Use: Every day  . Start date: 02/07/2015  . Devices: Vuse  Substance and Sexual Activity  . Alcohol use: Yes    Alcohol/week: 12.0 standard drinks    Types: 12 Cans of beer per week  . Drug use: Not Currently    Comment: 11-17-2020  per pt last used teen  . Sexual activity: Yes  Other Topics Concern  . Not on file  Social History Narrative  . Not on file   Social Determinants of Health   Financial Resource Strain: Not on file  Food Insecurity: Not on file  Transportation Needs: Not on file  Physical Activity: Not on file  Stress: Not on file  Social Connections: Not on file  Intimate Partner Violence: Not on file    Past Surgical History:  Procedure Laterality Date  . FINGER TENDON REPAIR Left 2010   index tendon repair from laceration  . PILONIDAL CYST EXCISION  09-19-2013  @ [facility]    Family History  Problem Relation Age of Onset  . Diabetes Father   . Diabetes Paternal Aunt   . Diabetes Paternal Grandmother     No Known Allergies  No current outpatient medications on file prior to visit.   No current facility-administered medications on file prior  to visit.    BP 139/89   Pulse 93   Temp 98.1 F (36.7 C) (Oral)   Resp 18   Ht 5\' 11"  (1.803 m)   Wt 225 lb (102.1 kg)   SpO2 96%   BMI 31.38 kg/m       Objective:   Physical Exam  General Mental Status- Alert. General Appearance- Not in acute distress.   Skin General: Color- Normal Color. Moisture- Normal Moisture.  Neck Carotid Arteries- Normal color. Moisture- Normal Moisture. No carotid bruits. No JVD.  Chest and Lung Exam Auscultation: Breath Sounds:-Normal.  Cardiovascular Auscultation:Rythm- Regular. Murmurs & Other Heart Sounds:Auscultation of the  heart reveals- No Murmurs.  Abdomen Inspection:-Inspeection Normal. Palpation/Percussion:Note:No mass. Palpation and Percussion of the abdomen reveal- Non Tender, Non Distended + BS, no rebound or guarding.    Neurologic Cranial Nerve exam:- CN III-XII intact(No nystagmus), symmetric smile. Strength:- 5/5 equal and symmetric strength both upper and lower extremities.      Assessment & Plan:  Your physical exam was normal except mild elevated  Blood pressure. In past blood pressure has been better. Elevation may be due to visit and upcoming surgery. EKG sinus rhythm.  Recommend low salt diet, exercise when healed from foot surgeries and keep weight under control. Follow bp in future.  Cbc and cmp today. Will fax form tomorrow for clearance after review lab.  Follow up in 4-6 weeks for bp check.  , PA-C

## 2020-11-18 NOTE — Patient Instructions (Addendum)
Your physical exam was normal except mild elevated  Blood pressure. In past blood pressure has been better. Elevation may be due to visit and upcoming surgery. EKG sinus rhythm.  Recommend low salt diet, exercise when healed from foot surgeries and keep weight under control. Follow bp in future.  Cbc and cmp today. Will fax form tomorrow for clearance after review lab.  Follow up in 4-6 weeks for bp check.

## 2020-11-19 ENCOUNTER — Telehealth: Payer: Self-pay | Admitting: Medical

## 2020-11-19 ENCOUNTER — Other Ambulatory Visit (HOSPITAL_COMMUNITY)
Admission: RE | Admit: 2020-11-19 | Discharge: 2020-11-19 | Disposition: A | Discharge status: Home / Self Care | Payer: 59 | Source: Ambulatory Visit | Attending: Podiatry | Admitting: Podiatry

## 2020-11-19 DIAGNOSIS — Z20822 Contact with and (suspected) exposure to covid-19: Secondary | ICD-10-CM | POA: Insufficient documentation

## 2020-11-19 DIAGNOSIS — Z01812 Encounter for preprocedural laboratory examination: Secondary | ICD-10-CM | POA: Insufficient documentation

## 2020-11-19 LAB — SARS CORONAVIRUS 2 (TAT 6-24 HRS): SARS Coronavirus 2: NEGATIVE

## 2020-11-19 NOTE — Telephone Encounter (Signed)
Will you go ahead and fax over his pre-op form today. Surgery is tomorrow.

## 2020-11-19 NOTE — Telephone Encounter (Signed)
Form faxed

## 2020-11-19 NOTE — Progress Notes (Signed)
H & P /medical claerance form received edward sagnir pa dated 11-19-2020 placed on chart for 11-20-2020 surgery

## 2020-11-19 NOTE — Anesthesia Preprocedure Evaluation (Addendum)
Anesthesia Evaluation  Patient identified by MRN, date of birth, ID band Patient awake    Reviewed: Allergy & Precautions, NPO status , Patient's Chart, lab work & pertinent test results  History of Anesthesia Complications Negative for: history of anesthetic complications  Airway Mallampati: I  TM Distance: >3 FB Neck ROM: Full    Dental no notable dental hx.    Pulmonary sleep apnea ,    Pulmonary exam normal        Cardiovascular negative cardio ROS Normal cardiovascular exam     Neuro/Psych Anxiety Depression negative neurological ROS     GI/Hepatic negative GI ROS, Neg liver ROS,   Endo/Other  BMI 33  Renal/GU negative Renal ROS  negative genitourinary   Musculoskeletal Left flat foot   Abdominal   Peds  Hematology negative hematology ROS (+)   Anesthesia Other Findings Day of surgery medications reviewed with patient.  Reproductive/Obstetrics negative OB ROS                            Anesthesia Physical Anesthesia Plan  ASA: II  Anesthesia Plan: General   Post-op Pain Management: GA combined w/ Regional for post-op pain   Induction: Intravenous  PONV Risk Score and Plan: 2 and Treatment may vary due to age or medical condition, Ondansetron, Dexamethasone and Midazolam  Airway Management Planned: LMA  Additional Equipment: None  Intra-op Plan:   Post-operative Plan: Extubation in OR  Informed Consent: I have reviewed the patients History and Physical, chart, labs and discussed the procedure including the risks, benefits and alternatives for the proposed anesthesia with the patient or authorized representative who has indicated his/her understanding and acceptance.     Dental advisory given  Plan Discussed with: CRNA  Anesthesia Plan Comments:        Anesthesia Quick Evaluation

## 2020-11-20 ENCOUNTER — Ambulatory Visit (HOSPITAL_BASED_OUTPATIENT_CLINIC_OR_DEPARTMENT_OTHER)
Admission: RE | Admit: 2020-11-20 | Discharge: 2020-11-20 | Disposition: A | Discharge status: Home / Self Care | Payer: 59 | Attending: Podiatry | Admitting: Podiatry

## 2020-11-20 ENCOUNTER — Ambulatory Visit (HOSPITAL_BASED_OUTPATIENT_CLINIC_OR_DEPARTMENT_OTHER): Payer: 59 | Admitting: Anesthesiology

## 2020-11-20 ENCOUNTER — Ambulatory Visit (HOSPITAL_COMMUNITY): Payer: 59

## 2020-11-20 ENCOUNTER — Other Ambulatory Visit: Payer: Self-pay

## 2020-11-20 ENCOUNTER — Ambulatory Visit (HOSPITAL_BASED_OUTPATIENT_CLINIC_OR_DEPARTMENT_OTHER): Payer: 59

## 2020-11-20 ENCOUNTER — Encounter (HOSPITAL_BASED_OUTPATIENT_CLINIC_OR_DEPARTMENT_OTHER): Payer: Self-pay | Admitting: Podiatry

## 2020-11-20 ENCOUNTER — Encounter (HOSPITAL_BASED_OUTPATIENT_CLINIC_OR_DEPARTMENT_OTHER)
Admission: RE | Disposition: A | Discharge status: Home / Self Care | Payer: Self-pay | Source: Home / Self Care | Attending: Podiatry

## 2020-11-20 DIAGNOSIS — M25572 Pain in left ankle and joints of left foot: Secondary | ICD-10-CM | POA: Diagnosis not present

## 2020-11-20 DIAGNOSIS — M21862 Other specified acquired deformities of left lower leg: Secondary | ICD-10-CM

## 2020-11-20 DIAGNOSIS — M216X2 Other acquired deformities of left foot: Secondary | ICD-10-CM

## 2020-11-20 DIAGNOSIS — M2142 Flat foot [pes planus] (acquired), left foot: Secondary | ICD-10-CM | POA: Diagnosis present

## 2020-11-20 DIAGNOSIS — Z9889 Other specified postprocedural states: Secondary | ICD-10-CM

## 2020-11-20 HISTORY — DX: Flat foot (pes planus) (acquired), unspecified foot: M21.40

## 2020-11-20 HISTORY — PX: FLAT FOOT RECONSTRUCTION-TAL GASTROC RECESSION: SHX6620

## 2020-11-20 HISTORY — DX: Obstructive sleep apnea (adult) (pediatric): G47.33

## 2020-11-20 SURGERY — FLAT FOOT RECONSTRUCTION-TAL GASTROC RECESSION
Anesthesia: General | Site: Foot | Laterality: Left

## 2020-11-20 MED ORDER — OXYCODONE HCL 5 MG PO TABS: 5.0000 mg | ORAL_TABLET | Freq: Once | ORAL | Status: DC | PRN

## 2020-11-20 MED ORDER — PROPOFOL 10 MG/ML IV BOLUS
INTRAVENOUS | Status: AC
  Filled 2020-11-20: qty 20

## 2020-11-20 MED ORDER — ONDANSETRON HCL 4 MG/2ML IJ SOLN
INTRAMUSCULAR | Status: DC | PRN
  Administered 2020-11-20: 4 mg via INTRAVENOUS

## 2020-11-20 MED ORDER — GABAPENTIN 300 MG PO CAPS: 300.0000 mg | ORAL_CAPSULE | Freq: Three times a day (TID) | ORAL | 0 refills | Status: DC

## 2020-11-20 MED ORDER — ONDANSETRON HCL 4 MG/2ML IJ SOLN
INTRAMUSCULAR | Status: AC
  Filled 2020-11-20: qty 2

## 2020-11-20 MED ORDER — LACTATED RINGERS IV SOLN
INTRAVENOUS | Status: DC
  Administered 2020-11-20: 1000 mL via INTRAVENOUS

## 2020-11-20 MED ORDER — FENTANYL CITRATE (PF) 100 MCG/2ML IJ SOLN
INTRAMUSCULAR | Status: AC
  Filled 2020-11-20: qty 2

## 2020-11-20 MED ORDER — OXYCODONE HCL 5 MG/5ML PO SOLN: 5.0000 mg | Freq: Once | ORAL | Status: DC | PRN

## 2020-11-20 MED ORDER — CEFAZOLIN SODIUM-DEXTROSE 2-4 GM/100ML-% IV SOLN
INTRAVENOUS | Status: AC
  Filled 2020-11-20: qty 100

## 2020-11-20 MED ORDER — LIDOCAINE 2% (20 MG/ML) 5 ML SYRINGE
INTRAMUSCULAR | Status: DC | PRN
  Administered 2020-11-20: 100 mg via INTRAVENOUS

## 2020-11-20 MED ORDER — ACETAMINOPHEN 500 MG PO TABS: 1000.0000 mg | ORAL_TABLET | Freq: Four times a day (QID) | ORAL | 0 refills | Status: AC | PRN

## 2020-11-20 MED ORDER — PHENYLEPHRINE 40 MCG/ML (10ML) SYRINGE FOR IV PUSH (FOR BLOOD PRESSURE SUPPORT)
PREFILLED_SYRINGE | INTRAVENOUS | Status: DC | PRN
  Administered 2020-11-20 (×2): 80 ug via INTRAVENOUS

## 2020-11-20 MED ORDER — CLONIDINE HCL (ANALGESIA) 100 MCG/ML EP SOLN
EPIDURAL | Status: DC | PRN
  Administered 2020-11-20: 33 ug
  Administered 2020-11-20: 67 ug

## 2020-11-20 MED ORDER — FENTANYL CITRATE (PF) 100 MCG/2ML IJ SOLN
100.0000 ug | Freq: Once | INTRAMUSCULAR | Status: AC
Start: 2020-11-20 — End: 2020-11-20
  Administered 2020-11-20: 100 ug via INTRAVENOUS

## 2020-11-20 MED ORDER — OXYCODONE HCL 5 MG PO TABS: 5.0000 mg | ORAL_TABLET | Freq: Four times a day (QID) | ORAL | 0 refills | Status: DC | PRN

## 2020-11-20 MED ORDER — ARTIFICIAL TEARS OPHTHALMIC OINT
TOPICAL_OINTMENT | OPHTHALMIC | Status: AC
  Filled 2020-11-20: qty 3.5

## 2020-11-20 MED ORDER — PROPOFOL 10 MG/ML IV BOLUS
INTRAVENOUS | Status: DC | PRN
  Administered 2020-11-20: 200 mg via INTRAVENOUS

## 2020-11-20 MED ORDER — ACETAMINOPHEN 500 MG PO TABS
1000.0000 mg | ORAL_TABLET | Freq: Once | ORAL | Status: AC
  Administered 2020-11-20: 1000 mg via ORAL

## 2020-11-20 MED ORDER — FENTANYL CITRATE (PF) 100 MCG/2ML IJ SOLN: 25.0000 ug | INTRAMUSCULAR | Status: DC | PRN

## 2020-11-20 MED ORDER — BUPIVACAINE-EPINEPHRINE (PF) 0.5% -1:200000 IJ SOLN
INTRAMUSCULAR | Status: DC | PRN
  Administered 2020-11-20: 20 mL via PERINEURAL
  Administered 2020-11-20: 10 mL via PERINEURAL

## 2020-11-20 MED ORDER — DEXAMETHASONE SODIUM PHOSPHATE 10 MG/ML IJ SOLN
INTRAMUSCULAR | Status: DC | PRN
  Administered 2020-11-20: 10 mg

## 2020-11-20 MED ORDER — PROMETHAZINE HCL 25 MG/ML IJ SOLN: 6.2500 mg | INTRAMUSCULAR | Status: DC | PRN

## 2020-11-20 MED ORDER — CEFAZOLIN SODIUM-DEXTROSE 2-4 GM/100ML-% IV SOLN
2.0000 g | INTRAVENOUS | Status: AC
  Administered 2020-11-20: 2 g via INTRAVENOUS

## 2020-11-20 MED ORDER — ACETAMINOPHEN 500 MG PO TABS
ORAL_TABLET | ORAL | Status: AC
  Filled 2020-11-20: qty 2

## 2020-11-20 MED ORDER — DEXAMETHASONE SODIUM PHOSPHATE 10 MG/ML IJ SOLN
INTRAMUSCULAR | Status: AC
  Filled 2020-11-20: qty 1

## 2020-11-20 MED ORDER — LIDOCAINE HCL (PF) 2 % IJ SOLN
INTRAMUSCULAR | Status: AC
  Filled 2020-11-20: qty 5

## 2020-11-20 MED ORDER — PHENYLEPHRINE 40 MCG/ML (10ML) SYRINGE FOR IV PUSH (FOR BLOOD PRESSURE SUPPORT)
PREFILLED_SYRINGE | INTRAVENOUS | Status: AC
  Filled 2020-11-20: qty 10

## 2020-11-20 MED ORDER — FENTANYL CITRATE (PF) 100 MCG/2ML IJ SOLN
INTRAMUSCULAR | Status: DC | PRN
  Administered 2020-11-20 (×2): 25 ug via INTRAVENOUS
  Administered 2020-11-20: 50 ug via INTRAVENOUS

## 2020-11-20 MED ORDER — KETOROLAC TROMETHAMINE 30 MG/ML IJ SOLN
INTRAMUSCULAR | Status: DC | PRN
  Administered 2020-11-20: 30 mg via INTRAVENOUS

## 2020-11-20 MED ORDER — IBUPROFEN 600 MG PO TABS: 600.0000 mg | ORAL_TABLET | Freq: Three times a day (TID) | ORAL | 0 refills | Status: AC | PRN

## 2020-11-20 MED ORDER — KETOROLAC TROMETHAMINE 30 MG/ML IJ SOLN
INTRAMUSCULAR | Status: AC
  Filled 2020-11-20: qty 1

## 2020-11-20 MED ORDER — MIDAZOLAM HCL 2 MG/2ML IJ SOLN
2.0000 mg | Freq: Once | INTRAMUSCULAR | Status: AC
  Administered 2020-11-20: 2 mg via INTRAVENOUS

## 2020-11-20 MED ORDER — MIDAZOLAM HCL 2 MG/2ML IJ SOLN
INTRAMUSCULAR | Status: AC
  Filled 2020-11-20: qty 2

## 2020-11-20 MED ORDER — MIDAZOLAM HCL 2 MG/2ML IJ SOLN
INTRAMUSCULAR | Status: DC | PRN
  Administered 2020-11-20 (×2): 1 mg via INTRAVENOUS

## 2020-11-20 MED ORDER — DEXAMETHASONE SODIUM PHOSPHATE 10 MG/ML IJ SOLN
INTRAMUSCULAR | Status: DC | PRN
  Administered 2020-11-20: 10 mg via INTRAVENOUS

## 2020-11-20 MED ORDER — ASPIRIN EC 325 MG PO TBEC: 325.0000 mg | DELAYED_RELEASE_TABLET | Freq: Two times a day (BID) | ORAL | 0 refills | Status: AC

## 2020-11-20 SURGICAL SUPPLY — 76 items
ALLOGRAFT COTTON WEDGE 6X24X14 (Orthopedic Graft) ×2 IMPLANT
ALLOGRAFT EVANS WEDGE 8X22X20 (Orthopedic Graft) ×2 IMPLANT
BLADE AVERAGE 25X9 (BLADE) ×2 IMPLANT
BLADE OSCILLATING/SAGITTAL (BLADE) ×2
BLADE SURG 15 STRL LF DISP TIS (BLADE) ×4 IMPLANT
BLADE SURG 15 STRL SS (BLADE) ×8
BLADE SW THK.38XMED LNG THN (BLADE) ×1 IMPLANT
BNDG CMPR 9X4 STRL LF SNTH (GAUZE/BANDAGES/DRESSINGS) ×1
BNDG CONFORM 2 STRL LF (GAUZE/BANDAGES/DRESSINGS) IMPLANT
BNDG ELASTIC 4X5.8 VLCR STR LF (GAUZE/BANDAGES/DRESSINGS) ×2 IMPLANT
BNDG ELASTIC 6X5.8 VLCR STR LF (GAUZE/BANDAGES/DRESSINGS) IMPLANT
BNDG ESMARK 4X9 LF (GAUZE/BANDAGES/DRESSINGS) ×2 IMPLANT
BNDG GAUZE ELAST 4 BULKY (GAUZE/BANDAGES/DRESSINGS) ×2 IMPLANT
COVER BACK TABLE 60X90IN (DRAPES) ×2 IMPLANT
COVER WAND RF STERILE (DRAPES) ×2 IMPLANT
CUFF TOURN SGL QUICK 18X4 (TOURNIQUET CUFF) IMPLANT
CUFF TOURN SGL QUICK 24 (TOURNIQUET CUFF) ×2
CUFF TRNQT CYL 24X4X16.5-23 (TOURNIQUET CUFF) ×1 IMPLANT
DRAPE C-ARM 35X43 STRL (DRAPES) IMPLANT
DRAPE C-ARM 42X120 X-RAY (DRAPES) ×2 IMPLANT
DRAPE C-ARMOR (DRAPES) IMPLANT
DRAPE EXTREMITY T 121X128X90 (DISPOSABLE) ×2 IMPLANT
DRAPE IMP U-DRAPE 54X76 (DRAPES) ×2 IMPLANT
DRAPE U-SHAPE 47X51 STRL (DRAPES) ×2 IMPLANT
DRSG ADAPTIC 3X8 NADH LF (GAUZE/BANDAGES/DRESSINGS) IMPLANT
DRSG EMULSION OIL 3X3 NADH (GAUZE/BANDAGES/DRESSINGS) IMPLANT
DRSG PAD ABDOMINAL 8X10 ST (GAUZE/BANDAGES/DRESSINGS) ×2 IMPLANT
DURAPREP 26ML APPLICATOR (WOUND CARE) ×2 IMPLANT
ELECT REM PT RETURN 9FT ADLT (ELECTROSURGICAL) ×2
ELECTRODE REM PT RTRN 9FT ADLT (ELECTROSURGICAL) ×1 IMPLANT
GAUZE 4X4 16PLY RFD (DISPOSABLE) IMPLANT
GAUZE SPONGE 4X4 12PLY STRL (GAUZE/BANDAGES/DRESSINGS) ×2 IMPLANT
GAUZE SPONGE 4X4 12PLY STRL LF (GAUZE/BANDAGES/DRESSINGS) ×2 IMPLANT
GAUZE XEROFORM 1X8 LF (GAUZE/BANDAGES/DRESSINGS) ×2 IMPLANT
GLOVE SURG ENC MOIS LTX SZ7 (GLOVE) ×4 IMPLANT
GLOVE SURG UNDER POLY LF SZ7.5 (GLOVE) ×6 IMPLANT
GOWN STRL REUS W/TWL LRG LVL3 (GOWN DISPOSABLE) ×4 IMPLANT
GUIDEWIRE UNTHREADED 2.0X150 (WIRE) ×8 IMPLANT
KIT TURNOVER CYSTO (KITS) ×2 IMPLANT
NDL SAFETY ECLIPSE 18X1.5 (NEEDLE) ×1 IMPLANT
NEEDLE HYPO 18GX1.5 SHARP (NEEDLE) ×2
NEEDLE HYPO 25X1 1.5 SAFETY (NEEDLE) ×2 IMPLANT
NS IRRIG 1000ML POUR BTL (IV SOLUTION) IMPLANT
PACK BASIN DAY SURGERY FS (CUSTOM PROCEDURE TRAY) ×2 IMPLANT
PAD CAST 4YDX4 CTTN HI CHSV (CAST SUPPLIES) ×3 IMPLANT
PADDING CAST ABS 4INX4YD NS (CAST SUPPLIES) ×1
PADDING CAST ABS COTTON 4X4 ST (CAST SUPPLIES) ×1 IMPLANT
PADDING CAST COTTON 4X4 STRL (CAST SUPPLIES) ×6
PENCIL SMOKE EVACUATOR (MISCELLANEOUS) ×2 IMPLANT
SLEEVE SURGEON STRL (DRAPES) ×2 IMPLANT
SPLINT FIBERGLASS 4X30 (CAST SUPPLIES) ×2 IMPLANT
STOCKINETTE 6  STRL (DRAPES) ×1
STOCKINETTE 6 STRL (DRAPES) ×1 IMPLANT
STRIP CLOSURE SKIN 1/2X4 (GAUZE/BANDAGES/DRESSINGS) IMPLANT
SUCTION FRAZIER HANDLE 10FR (MISCELLANEOUS) ×1
SUCTION TUBE FRAZIER 10FR DISP (MISCELLANEOUS) ×1 IMPLANT
SUT ETHILON 3 0 PS 1 (SUTURE) ×6 IMPLANT
SUT MERSILENE 2.0 SH NDLE (SUTURE) ×2 IMPLANT
SUT MERSILENE 3 0 FS 1 (SUTURE) IMPLANT
SUT MNCRL AB 3-0 PS2 18 (SUTURE) ×2 IMPLANT
SUT MNCRL AB 4-0 PS2 18 (SUTURE) IMPLANT
SUT MON AB 2-0 SH 27 (SUTURE)
SUT MON AB 2-0 SH27 (SUTURE) IMPLANT
SUT MON AB 3-0 SH 27 (SUTURE)
SUT MON AB 3-0 SH27 (SUTURE) IMPLANT
SUT MON AB 4-0 PS1 27 (SUTURE) IMPLANT
SUT MON AB 5-0 PS2 18 (SUTURE) ×2 IMPLANT
SUT PROLENE 3 0 PS 2 (SUTURE) IMPLANT
SUT VIC AB 2-0 SH 27 (SUTURE) ×4
SUT VIC AB 2-0 SH 27XBRD (SUTURE) ×2 IMPLANT
SYR 10ML LL (SYRINGE) IMPLANT
SYR BULB EAR ULCER 3OZ GRN STR (SYRINGE) ×2 IMPLANT
SYR TB 1ML LL NO SAFETY (SYRINGE) ×2 IMPLANT
TOWEL OR 17X26 10 PK STRL BLUE (TOWEL DISPOSABLE) ×4 IMPLANT
TUBE CONNECTING 12X1/4 (SUCTIONS) ×2 IMPLANT
UNDERPAD 30X36 HEAVY ABSORB (UNDERPADS AND DIAPERS) ×2 IMPLANT

## 2020-11-20 NOTE — Progress Notes (Signed)
Assisted Dr. Howze with left, ultrasound guided, popliteal, adductor canal block. Side rails up, monitors on throughout procedure. See vital signs in flow sheet. Tolerated Procedure well. 

## 2020-11-20 NOTE — H&P (Signed)
History and Physical Interval Note:  11/20/2020 7:17 AM  Chad Long  has presented today for surgery, with the diagnosis of equinus deformity, pes planus, sinus tarsi syndrome.  The various methods of treatment have been discussed with the patient and family. After consideration of risks, benefits and other options for treatment, the patient has consented to   Procedure(s) with comments: FLAT FOOT RECONSTRUCTION-TAL GASTROC RECESSION (Left) - WITH A BLOCK as a surgical intervention.  The patient's history has been reviewed, patient examined, no change in status, stable for surgery.  I have reviewed the patient's chart and labs.  Questions were answered to the patient's satisfaction.     Edwin Cap

## 2020-11-20 NOTE — Brief Op Note (Addendum)
11/20/2020  10:50 AM  PATIENT:  Lorette Ang  30 y.o. male  PRE-OPERATIVE DIAGNOSIS:  FLAT FOOT LEFT, gastrocnemius equinus  POST-OPERATIVE DIAGNOSIS:  FLAT FOOT LEFT, gastrocnemius equinus  PROCEDURE:  Procedure(s) with comments: FLAT FOOT RECONSTRUCTION-TAL GASTROC RECESSION (Left) - WITH A BLOCK  SURGEON:  Surgeon(s) and Role:    * Andrell Bergeson, Rachelle Hora, DPM - Primary    * Park Liter, DPM - Assisting  PHYSICIAN ASSISTANT:   ASSISTANTS: none   ANESTHESIA:   regional and general  EBL:  50 mL   BLOOD ADMINISTERED:none  DRAINS: none   LOCAL MEDICATIONS USED:  4mg  dexamethasone phosphate into sinus tarsi injected  SPECIMEN:  No Specimen  DISPOSITION OF SPECIMEN:  N/A  COUNTS:  YES  TOURNIQUET:   Total Tourniquet Time Documented: Thigh (Left) - 120 minutes Thigh (Left) - 71 minutes Total: Thigh (Left) - 191 minutes   DICTATION: .Note written in EPIC  PLAN OF CARE: Discharge to home after PACU  PATIENT DISPOSITION:  PACU - hemodynamically stable.   Delay start of Pharmacological VTE agent (>24hrs) due to surgical blood loss or risk of bleeding: yes

## 2020-11-20 NOTE — Anesthesia Procedure Notes (Signed)
Procedure Name: LMA Insertion Date/Time: 11/20/2020 7:49 AM Performed by: Norva Pavlov, CRNA Pre-anesthesia Checklist: Patient identified, Emergency Drugs available, Suction available and Patient being monitored Patient Re-evaluated:Patient Re-evaluated prior to induction Oxygen Delivery Method: Circle System Utilized Preoxygenation: Pre-oxygenation with 100% oxygen Induction Type: IV induction Ventilation: Mask ventilation without difficulty LMA: LMA with gastric port inserted LMA Size: 5.0 Number of attempts: 1 Placement Confirmation: positive ETCO2 Tube secured with: Tape Dental Injury: Teeth and Oropharynx as per pre-operative assessment

## 2020-11-20 NOTE — Anesthesia Postprocedure Evaluation (Signed)
Anesthesia Post Note  Patient: Chad Long  Procedure(s) Performed: FLAT FOOT RECONSTRUCTION-TAL GASTROC RECESSION (Left Foot)     Patient location during evaluation: PACU Anesthesia Type: General Level of consciousness: awake and alert and oriented Pain management: pain level controlled Vital Signs Assessment: post-procedure vital signs reviewed and stable Respiratory status: spontaneous breathing, nonlabored ventilation and respiratory function stable Cardiovascular status: blood pressure returned to baseline Postop Assessment: no apparent nausea or vomiting Anesthetic complications: no   No complications documented.  Last Vitals:  Vitals:   11/20/20 1045 11/20/20 1100  BP: 118/71 133/72  Pulse: 93 (!) 104  Resp: 17 11  Temp:    SpO2: 99% 99%    Last Pain:  Vitals:   11/20/20 1100  TempSrc:   PainSc: 0-No pain                 Kaylyn Layer

## 2020-11-20 NOTE — Anesthesia Procedure Notes (Signed)
Anesthesia Regional Block: Adductor canal block   Pre-Anesthetic Checklist: ,, timeout performed, Correct Patient, Correct Site, Correct Laterality, Correct Procedure, Correct Position, site marked, Risks and benefits discussed, pre-op evaluation,  At surgeon's request and post-op pain management  Laterality: Left  Prep: Maximum Sterile Barrier Precautions used, chloraprep       Needles:  Injection technique: Single-shot  Needle Type: Echogenic Stimulator Needle     Needle Length: 9cm  Needle Gauge: 22     Additional Needles:   Procedures:,,,, ultrasound used (permanent image in chart),,,,  Narrative:  Start time: 11/20/2020 7:12 AM End time: 11/20/2020 7:14 AM Injection made incrementally with aspirations every 5 mL.  Performed by: Personally  Anesthesiologist: Kaylyn Layer, MD  Additional Notes: Risks, benefits, and alternative discussed. Patient gave consent for procedure. Patient prepped and draped in sterile fashion. Sedation administered, patient remains easily responsive to voice. Relevant anatomy identified with ultrasound guidance. Local anesthetic given in 5cc increments with no signs or symptoms of intravascular injection. No pain or paraesthesias with injection. Patient monitored throughout procedure with signs of LAST or immediate complications. Tolerated well. Ultrasound image placed in chart.  Amalia Greenhouse, MD

## 2020-11-20 NOTE — Anesthesia Procedure Notes (Signed)
Anesthesia Regional Block: Popliteal block   Pre-Anesthetic Checklist: ,, timeout performed, Correct Patient, Correct Site, Correct Laterality, Correct Procedure, Correct Position, site marked, Risks and benefits discussed, pre-op evaluation,  At surgeon's request and post-op pain management  Laterality: Left  Prep: Maximum Sterile Barrier Precautions used, chloraprep       Needles:  Injection technique: Single-shot  Needle Type: Echogenic Stimulator Needle     Needle Length: 9cm  Needle Gauge: 22     Additional Needles:   Procedures:,,,, ultrasound used (permanent image in chart),,,,  Narrative:  Start time: 11/20/2020 7:15 AM End time: 11/20/2020 7:17 AM Injection made incrementally with aspirations every 5 mL.  Performed by: Personally  Anesthesiologist: Kaylyn Layer, MD  Additional Notes: Risks, benefits, and alternative discussed. Patient gave consent for procedure. Patient prepped and draped in sterile fashion. Sedation administered, patient remains easily responsive to voice. Relevant anatomy identified with ultrasound guidance. Local anesthetic given in 5cc increments with no signs or symptoms of intravascular injection. No pain or paraesthesias with injection. Patient monitored throughout procedure with signs of LAST or immediate complications. Tolerated well. Ultrasound image placed in chart.  Amalia Greenhouse, MD

## 2020-11-20 NOTE — Transfer of Care (Signed)
Immediate Anesthesia Transfer of Care Note  Patient: Chad Long  Procedure(s) Performed: Procedure(s) (LRB): FLAT FOOT RECONSTRUCTION-TAL GASTROC RECESSION (Left)  Patient Location: PACU  Anesthesia Type: General  Level of Consciousness: awake, alert  and oriented  Airway & Oxygen Therapy: Patient Spontanous Breathing and Patient connected to nasal cannula oxygen  Post-op Assessment: Report given to PACU RN and Post -op Vital signs reviewed and stable  Post vital signs: Reviewed and stable  Complications: No apparent anesthesia complications  Last Vitals:  Vitals Value Taken Time  BP 116/69 11/20/20 1037  Temp    Pulse 94 11/20/20 1042  Resp 11 11/20/20 1042  SpO2 99 % 11/20/20 1042  Vitals shown include unvalidated device data.  Last Pain:  Vitals:   11/20/20 0605  TempSrc: Oral  PainSc: 3       Patients Stated Pain Goal: 3 (11/20/20 2761)  Complications: No complications documented.

## 2020-11-20 NOTE — H&P (Signed)
Anesthesia H&P Update: History and Physical Exam reviewed; patient is OK for planned anesthetic and procedure. ? ?

## 2020-11-20 NOTE — Discharge Instructions (Signed)
Post-Surgery Instructions  1. If you are recuperating from surgery anywhere other than home, please be sure to leave [place] a number where you can be reached. 2. Go directly home and rest. 3. The keep operated foot (or feet) elevated six inches above the hip when sitting or lying down. 4. Support the elevated foot and leg with pillows under the calf. DO NOT PLACE PILLOWS UNDER THE KNEE. 5. DO NOT REMOVE or get your bandages wet. This will increase your chances of getting an infection. 6. Wear your surgical shoe at all times when you are up. 7. A limited amount of pain and swelling may occur. The skin may take on a bruised appearance. This is no cause for alarm. 8. For slight pain and swelling, apply an ice pack behind the knee for 15 minutes every hour. Continue icing until seen in the office. DO NOT apply any form of heat to the area. 9. Have prescription(s) filled immediately and take as directed. 10. Drink lots of liquids, water, and juice. 11. CALL THE OFFICE IMMEDIATELY IF: a. Bleeding continues b. Pain increases and/or does not respond to medication c. Bandage or cast appears too tight d. Any liquids (water, coffee, etc.) have spilled on your bandages. e. Tripping, falling, or stubbing the surgical foot f. If your temperature rises above 101 g. If you have ANY questions at all 12. Please use the crutches, knee scooter, or walker you have prescribed, rented, or purchased. If you are non-weight bearing DO NOT put weight on the operated foot for _________ days. If you are weight-bearing, follow your physicians instructions. You are expected to be: ? weight-bearing X non-weight bearing 13. Special Instructions: A knee scooter can be purchased or rented from: [employer], [address] [place]. [place] [address]. [phone]. [employer] also sells knee scooters online 14. Your next appointment is: 11/26/2020 10:15 AM  If you need to reach the nurse for any reason,  please call: [place]: [phone] [place]: [phone] [place]: [phone]     Post Anesthesia Home Care Instructions  Activity: Get plenty of rest for the remainder of the day. A responsible individual must stay with you for 24 hours following the procedure.  For the next 24 hours, DO NOT: -Drive a car -Advertising copywriter -Drink alcoholic beverages -Take any medication unless instructed by your physician -Make any legal decisions or sign important papers.  Meals: Start with liquid foods such as gelatin or soup. Progress to regular foods as tolerated. Avoid greasy, spicy, heavy foods. If nausea and/or vomiting occur, drink only clear liquids until the nausea and/or vomiting subsides. Call your physician if vomiting continues.  Special Instructions/Symptoms: Your throat may feel dry or sore from the anesthesia or the breathing tube placed in your throat during surgery. If this causes discomfort, gargle with warm salt water. The discomfort should disappear within 24 hours.  If you had a scopolamine patch placed behind your ear for the management of post- operative nausea and/or vomiting:  1. The medication in the patch is effective for 72 hours, after which it should be removed.  Wrap patch in a tissue and discard in the trash. Wash hands thoroughly with soap and water. 2. You may remove the patch earlier than 72 hours if you experience unpleasant side effects which may include dry mouth, dizziness or visual disturbances. 3. Avoid touching the patch. Wash your hands with soap and water after contact with the patch.    Regional Anesthesia Blocks  1. Numbness or  the inability to move the "blocked" extremity may last from 3-48 hours after placement. The length of time depends on the medication injected and your individual response to the medication. If the numbness is not going away after 48 hours, call your surgeon.  2. The extremity that is blocked will  need to be protected until the numbness is gone and the  Strength has returned. Because you cannot feel it, you will need to take extra care to avoid injury. Because it may be weak, you may have difficulty moving it or using it. You may not know what position it is in without looking at it while the block is in effect.  3. For blocks in the legs and feet, returning to weight bearing and walking needs to be done carefully. You will need to wait until the numbness is entirely gone and the strength has returned. You should be able to move your leg and foot normally before you try and bear weight or walk. You will need someone to be with you when you first try to ensure you do not fall and possibly risk injury.  4. Bruising and tenderness at the needle site are common side effects and will resolve in a few days.  5. Persistent numbness or new problems with movement should be communicated to the surgeon or the [facility] [phone] [facility] [phone]).

## 2020-11-21 ENCOUNTER — Encounter: Payer: Self-pay | Admitting: Sports Medicine

## 2020-11-21 NOTE — Progress Notes (Signed)
Patient called Answering Service stating he had Surgey with Dr. Lilian Kapur and that he is having a lot of pain. Has been taking his oxycodone 5 mg as directed every six hours and he's wondering if he can double up on the medication. I advised patient to continue to take it as directed but to add on taking Tylenol with the medication at the same time to give additional relief. I also advised patient to continue consistently taking ibuprofe n in between doses of his pain medication and continue with rest ice and elevation. Advise patient to call back if pain still continues to worsen. Dr. Marylene Land

## 2020-11-22 DIAGNOSIS — M216X2 Other acquired deformities of left foot: Secondary | ICD-10-CM

## 2020-11-22 DIAGNOSIS — M2142 Flat foot [pes planus] (acquired), left foot: Secondary | ICD-10-CM

## 2020-11-22 DIAGNOSIS — M21862 Other specified acquired deformities of left lower leg: Secondary | ICD-10-CM

## 2020-11-22 DIAGNOSIS — M25572 Pain in left ankle and joints of left foot: Secondary | ICD-10-CM

## 2020-11-22 NOTE — Op Note (Signed)
Patient Name: Chad Long DOB: [birth year]  MRN: [id number]   Date of Service: 11/20/2020  Surgeon: Dr. 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 Item Serial No. Manufacturer Lot No. LRB No. Used Action  ALLOGRAFT EVANS WEDGE [id number] - [id number] Orthopedic Graft ALLOGRAFT EVANS WEDGE [id number]  [employer] [id number] Left 1 Implanted  ALLOGRAFT COTTON WEDGE [id number] - [id number] Orthopedic Graft ALLOGRAFT COTTON WEDGE [id number]  [employer] [id number] Left 1 Implanted   Medications: 1cc dexamethasone phosphate Complications: none  Indications for Procedure:  This is a 30 y.o. male with a history of painful pes planovalgus deformity, gastrocnemius equinus and sinus tarsi syndrome. After failing non surgical treatment he opted for surgical reconstruction. All risks, benefits, and potential complications were discussed prior to surgery. Informed consent was reviewed and signed in the office and was available on the day of surgery for review as well.   Procedure in Detail: The patient was identified in the preoperative holding area and the correct site and side of surgery was reviewed, confirmed and the left lower extremity was marked with an indelible marker. He underwent regional block of the popliteal and  saphenous nerves by the anesthesia team. Following this, he was taken to the operating room and transferred from the stretcher to the operating room table. General inhalational anesthesia was then induced. A timeout for safety per protocol was then performed. The left lower extremity was then prepped and draped in the usual sterile fashion. The left lower extremity was elevated, exsanguinated and the pneumatic thigh tourniquet was inflated to 325 mmHg.  I directed my attention to the posterior calf where a paramedian posterior incision was made overlying the gastrocnemius aponeurosis just distal to the medial gastrocnemius head. This was carried deep through subcutaneous tissues and small bleeding vessels that could not be safely retracted were clamped and tied or Bovied as necessary. The saphenous neurovascular bundle was readily identified and protected. Dissection was carried down to the level of the deep fascia and the paratenon of the gastrocnemius, and the aponeurosis was readily identified. The paratenon was incised and it was retracted medially and laterally ensuring that the sural nerve and saphenous bundle was safely protected throughout this part of the procedure. The gastrocnemius aponeurosis was incised transversely with gentle dorsiflexion placed on the foot at the ankle until adequately lengthened. This wound was then thoroughly irrigated with normal sterile saline and closure was initiated. The paratenon was repaired with 2-0 Vicryl, subcutaneous tissue was repaired with 2-0 Vicryl and 3-0 Monocryl and skin was repaired with 3-0 nylon.  We then turned our attention to the lateral foot overlying the calcaneus. Fluoroscopy was used to confirm placement of the incision where the Evans osteotomy would need to be made. A horizontal incision was then made and dissection was carried deep through subcutaneous tissues ensuring all vital neurovascular structures were  retracted safely out of harms way.  Small bleeding vessels were cauterized and clamped and tied as necessary. The sural nerve and the peroneal tendons were identified and safely retracted. The dissection was carried deep to the level of the lateral wall of the calcaneus and fluoroscopy was again used to confirm the position of the osteotomy just anterior to the posterior facet in the floor of the sinus tarsi. The osteotomy was created using a sagittal saw and completed with an osteotome through the medial cortex. A Hintermann retractor with a 2.0 mm Steinmann pin was then placed to distract the osteotomy. We then directed our attention to the dorsal midfoot overlying the medial cuneiform. An incision was made overlying this and carried deep through subcutaneous tissue. The neurovascular bundle was retracted laterally and the extensor hallucis longus tendon was retracted medially. The periosteum was incised and reflected. Position and placement of a Cotton osteotomy was confirmed with fluoroscopy. This was then created using a sagittal saw, the plantar cortex was left intact. Once this was complete, trial sizers for bone graft wedges were then placed in each of the osteotomy sites. It was determined that a size 8 mm Evans wedge graft and a size 6 mm Cotton wedge graft would be adequate. This gave adequate triplanar correction and recreation of the medial longitudinal arch with reduction in forefoot abduction. This correction was confirmed with fluoroscopy. The bone graft wedges were then rehydrated in normal sterile saline for 5 minutes. They were then placed into the osteotomy sites and tamped carefully into position ensuring that the dorsal cortex of the Cotton graft and lateral and dorsal cortex cortices of the Evan's graft were not prominent.   Final fluoroscopic films were then taken. A calcaneal axial simulated Saltzman view was taken and it was determined at this time that a medial displacement calcaneal osteotomy would not be necessary as he  had adequate correction via the Evans osteotomy and Cotton osteotomy. Both wounds were then thoroughly irrigated and closure was initiated with deep fascia and periosteum closed with 2-0 Vicryl, subcutaneous tissues closed with 3-0 Monocryl and the skin being closed with 3-0 nylon. The tourniquet was deflated after a total of 120 minutes with immediate return of peripheral pulses and capillary refill to all digits. Hemostasis was achieved following tourniquet deflation and prior to closure. Dressings were applied with xeroform, 4 x 4 gauze, Kerlix an ACE wrap and ABD pad and a well-padded below the knee splint with the foot positioned at a right angle of the leg. He will be nonweightbearing in a below knee splint and will follow up with me in 1 week in the office.   Disposition: Following a period of post-operative monitoring, patient will be transferred to home.

## 2020-11-23 ENCOUNTER — Encounter (HOSPITAL_BASED_OUTPATIENT_CLINIC_OR_DEPARTMENT_OTHER): Payer: Self-pay | Admitting: Podiatry

## 2020-11-23 ENCOUNTER — Telehealth: Payer: Self-pay | Admitting: Podiatry

## 2020-11-23 MED ORDER — OXYCODONE HCL 5 MG PO TABS: 5.0000 mg | ORAL_TABLET | Freq: Four times a day (QID) | ORAL | 0 refills | Status: DC | PRN

## 2020-11-23 NOTE — Telephone Encounter (Signed)
I spoke with the patient and he confirmed that he jumped over the bottle when he was taking it and all the 3 tablets went down the vent.  I discussed with him that it may be difficult to have this filled due to restrictions of narcotic prescribing.  Also advised him to continue to ice and elevate as much as possible.  Reviewed postoperative protocol and he is taking 1000 mg of acetaminophen, aspirin twice daily 325 mg, 600 mg of ibuprofen every 8 hours, 300 mg of gabapentin every 8 hours, he was also taking at 1000 mg of Advil on top of this and advised him that this is the same thing as ibuprofen he should stop taking it.  Currently no symptoms of GI upset or renal dysfunction.

## 2020-11-23 NOTE — Addendum Note (Signed)
Addended byLilian Kapur, Berniece Abid R on: 11/23/2020 04:23 PM   Modules accepted: Orders

## 2020-11-23 NOTE — Telephone Encounter (Signed)
Patient calling to request another refill. Patient dropped pills and was only able to recover 3. Patient is in in pain  oxyCODONE (OXY IR/ROXICODONE) 5 MG

## 2020-11-26 ENCOUNTER — Other Ambulatory Visit: Payer: Self-pay

## 2020-11-26 ENCOUNTER — Ambulatory Visit (INDEPENDENT_AMBULATORY_CARE_PROVIDER_SITE_OTHER): Payer: 59 | Admitting: Podiatry

## 2020-11-26 ENCOUNTER — Ambulatory Visit (INDEPENDENT_AMBULATORY_CARE_PROVIDER_SITE_OTHER): Payer: 59

## 2020-11-26 DIAGNOSIS — M216X2 Other acquired deformities of left foot: Secondary | ICD-10-CM

## 2020-11-26 DIAGNOSIS — M2142 Flat foot [pes planus] (acquired), left foot: Secondary | ICD-10-CM

## 2020-11-26 DIAGNOSIS — M25572 Pain in left ankle and joints of left foot: Secondary | ICD-10-CM

## 2020-11-26 DIAGNOSIS — M21862 Other specified acquired deformities of left lower leg: Secondary | ICD-10-CM

## 2020-11-26 MED ORDER — DOCUSATE SODIUM 100 MG PO CAPS: 100.0000 mg | ORAL_CAPSULE | Freq: Two times a day (BID) | ORAL | 0 refills | Status: DC

## 2020-11-26 MED ORDER — DOCUSATE SODIUM 100 MG PO CAPS: 200.0000 mg | ORAL_CAPSULE | Freq: Two times a day (BID) | ORAL | 0 refills | Status: DC

## 2020-11-26 NOTE — Patient Instructions (Addendum)
Medications:  Take the colace (stool softener) 200mg  twice daily  Take 325 (full strength) aspiring twice daily  Take 1,000mg  (2 extra strength) tylenol every 6 hours  Take ibuprofen 600mg  every 6 hours  Take 1-2 oxycodone 5mg  every 4-6 hours only as needed  Drink lots of water   Always keep the leg up, elevated, and ice behind your knee (15 minutes every hour)

## 2020-11-27 ENCOUNTER — Encounter: Payer: 59 | Admitting: Podiatry

## 2020-11-29 ENCOUNTER — Encounter: Payer: Self-pay | Admitting: Podiatry

## 2020-11-29 ENCOUNTER — Other Ambulatory Visit: Payer: Self-pay | Admitting: Podiatry

## 2020-11-29 MED ORDER — OXYCODONE HCL 5 MG PO TABS: 5.0000 mg | ORAL_TABLET | Freq: Four times a day (QID) | ORAL | 0 refills | Status: AC | PRN

## 2020-11-29 NOTE — Progress Notes (Signed)
PRN postop 

## 2020-11-29 NOTE — Progress Notes (Signed)
  Subjective:  Patient ID: Chad Long, male    DOB: 04-May-1991,  MRN: [id number]  Chief Complaint  Patient presents with  . Routine Post Op    POV #1 DOS 11/20/2020 LT FOOT FLAT FOOT RECONSTRUCTIION W/CALF MUSCLE LENGTHENING, CUT IN HEEL BONE, MIDFOOT, USE OF BONE GRAFT    DOS: 11/20/2020 Procedure: Left foot flatfoot reconstruction with Strayer/Evans/Cotton  30 y.o. male returns for post-op check.  Feeling well, having significant pain.  He was able to obtain his pain medication prescription.  States he was only taking the Tylenol and oxycodone.  Not taking aspirin.  Has not had a bowel movement since surgery.  Reports he is able to pass gas  Review of Systems: Negative except as noted in the HPI. Denies N/V/F/Ch.   Objective:  There were no vitals filed for this visit. There is no height or weight on file to calculate BMI. Constitutional Well developed. Well nourished.  Vascular Foot warm and well perfused. Capillary refill normal to all digits.   Neurologic Normal speech. Oriented to person, place, and time. Epicritic sensation to light touch grossly present bilaterally.  Dermatologic Skin healing well without signs of infection. Skin edges well coapted without signs of infection.  Orthopedic: Tenderness to palpation noted about the surgical site.  Moderate edema   Radiographs: Maintained surgical correction of Evans osteotomy and cotton tarsal osteotomy Assessment:   1. Acquired pes planus, left   2. Sinus tarsi syndrome of left ankle   3. Gastrocnemius equinus of left lower extremity    Plan:  Patient was evaluated and treated and all questions answered.  S/p foot surgery left -Progressing as expected post-operatively. -Stool softener (Colace prescribed). -Reviewed and gave him written down information on his medication regimen as well.  He needs to ice and elevate.  Take medication as prescribed./Important for the Colace, aspirin and the pain medications are does not  have to use any opioids. -XR: Maintain surgical correction -WB Status: NWB in high top CAM boot -Sutures: We will leave intact for 2 weeks. -Medications: As above -Foot redressed.  No follow-ups on file.

## 2020-12-01 ENCOUNTER — Encounter: Payer: 59 | Admitting: Podiatry

## 2020-12-05 ENCOUNTER — Other Ambulatory Visit: Payer: Self-pay | Admitting: Podiatry

## 2020-12-06 NOTE — Telephone Encounter (Signed)
Please advise 

## 2020-12-10 ENCOUNTER — Other Ambulatory Visit: Payer: Self-pay

## 2020-12-10 ENCOUNTER — Ambulatory Visit (INDEPENDENT_AMBULATORY_CARE_PROVIDER_SITE_OTHER): Payer: 59 | Admitting: Podiatry

## 2020-12-10 ENCOUNTER — Ambulatory Visit (INDEPENDENT_AMBULATORY_CARE_PROVIDER_SITE_OTHER): Payer: 59

## 2020-12-10 ENCOUNTER — Encounter: Payer: Self-pay | Admitting: Podiatry

## 2020-12-10 DIAGNOSIS — M2142 Flat foot [pes planus] (acquired), left foot: Secondary | ICD-10-CM

## 2020-12-10 DIAGNOSIS — M25572 Pain in left ankle and joints of left foot: Secondary | ICD-10-CM

## 2020-12-10 DIAGNOSIS — M216X2 Other acquired deformities of left foot: Secondary | ICD-10-CM

## 2020-12-10 DIAGNOSIS — M21862 Other specified acquired deformities of left lower leg: Secondary | ICD-10-CM

## 2020-12-10 DIAGNOSIS — Z9889 Other specified postprocedural states: Secondary | ICD-10-CM

## 2020-12-10 MED ORDER — OXYCODONE HCL 5 MG PO TABS: 5.0000 mg | ORAL_TABLET | ORAL | 0 refills | Status: AC | PRN

## 2020-12-10 NOTE — Progress Notes (Signed)
  Subjective:  Patient ID: Chad Long, male    DOB: 25-Jan-1991,  MRN: [id number]  Chief Complaint  Patient presents with  . Routine Post Op    PT stated that he is still having some pain and swelling.    DOS: 11/20/2020 Procedure: Left foot flatfoot reconstruction with Strayer/Drenda Sobecki/Cotton  30 y.o. male returns for post-op check.  Today he presents without his boot and only the knee scooter.  When asked where it is he says "it is too heavy" reports that he did fall and put weight down onto the foot.  Says he still has quite a bit of pain.  Has been taking medication as we discussed last time  Review of Systems: Negative except as noted in the HPI. Denies N/V/F/Ch.   Objective:  There were no vitals filed for this visit. There is no height or weight on file to calculate BMI. Constitutional Well developed. Well nourished.  Vascular Foot warm and well perfused. Capillary refill normal to all digits.   Neurologic Normal speech. Oriented to person, place, and time. Epicritic sensation to light touch grossly present bilaterally.  Dermatologic Skin healing well without signs of infection. Skin edges well coapted without signs of infection.  Sutures intact  Orthopedic: Tenderness to palpation noted about the surgical site.  Moderate edema   Radiographs: No x-rays taken today, similar to previous appearance and has maintained surgical correction of Chad Long osteotomy and cotton tarsal osteotomy, no evidence of fracture or displacement of graft or osteotomy from fall Assessment:   1. Sinus tarsi syndrome of left ankle   2. Acquired pes planus, left    Plan:  Patient was evaluated and treated and all questions answered.  S/p foot surgery left -Emphasize importance of remaining nonweightbearing with protection including the CAM boot.  Explained the rationale of why we do this subject the osteotomy site to prevent displacement and risk of delayed or nonunion.  He will put the boot on soon as  he gets home today and not to take it off.  I also advised him to be sleeping in it because this will help to maintain the gastrocnemius recession that we performed. -WB Status: NWB in high top CAM boot -Sutures: Removed today.  He may begin to bathe.  Boot can only be removed today. -Keep Ace wrap around foot to manage edema  Return in about 5 weeks (around 01/14/2021).

## 2020-12-15 ENCOUNTER — Encounter: Payer: 59 | Admitting: Podiatry

## 2020-12-18 ENCOUNTER — Telehealth: Payer: Self-pay | Admitting: Podiatry

## 2020-12-18 ENCOUNTER — Other Ambulatory Visit: Payer: Self-pay | Admitting: Podiatry

## 2020-12-18 MED ORDER — OXYCODONE HCL 5 MG PO TABS: 5.0000 mg | ORAL_TABLET | ORAL | 0 refills | Status: DC | PRN

## 2020-12-18 NOTE — Telephone Encounter (Signed)
Patient called on call provider and stated while transferring from his bed he slipped and fell and felt a pop in his foot. Was not wearing boot at the time (despite being advised to do so per chart review). Patient states he had instant pain and swelling. I advised him to rest, ice, and elevate the area. I did refill his pain medication. Discussed with patient moving up his next appointment so we can evaluate his surgical site - patient to call Monday morning.

## 2020-12-18 NOTE — Progress Notes (Signed)
Pain refill sent to pharmacy

## 2020-12-21 NOTE — Telephone Encounter (Signed)
OK thanks! Time for a cast I think

## 2020-12-24 ENCOUNTER — Other Ambulatory Visit: Payer: Self-pay

## 2020-12-24 ENCOUNTER — Encounter: Payer: Self-pay | Admitting: Podiatry

## 2020-12-24 ENCOUNTER — Ambulatory Visit (INDEPENDENT_AMBULATORY_CARE_PROVIDER_SITE_OTHER): Payer: 59 | Admitting: Podiatry

## 2020-12-24 ENCOUNTER — Ambulatory Visit (INDEPENDENT_AMBULATORY_CARE_PROVIDER_SITE_OTHER): Payer: 59

## 2020-12-24 DIAGNOSIS — Z9889 Other specified postprocedural states: Secondary | ICD-10-CM

## 2020-12-24 DIAGNOSIS — M216X2 Other acquired deformities of left foot: Secondary | ICD-10-CM

## 2020-12-24 DIAGNOSIS — Y92009 Unspecified place in unspecified non-institutional (private) residence as the place of occurrence of the external cause: Secondary | ICD-10-CM

## 2020-12-24 DIAGNOSIS — M21862 Other specified acquired deformities of left lower leg: Secondary | ICD-10-CM

## 2020-12-24 DIAGNOSIS — W19XXXA Unspecified fall, initial encounter: Secondary | ICD-10-CM

## 2020-12-24 DIAGNOSIS — M25572 Pain in left ankle and joints of left foot: Secondary | ICD-10-CM

## 2020-12-24 DIAGNOSIS — M2142 Flat foot [pes planus] (acquired), left foot: Secondary | ICD-10-CM

## 2020-12-24 NOTE — Progress Notes (Signed)
  Subjective:  Patient ID: Chad Long, male    DOB: 10-03-1991,  MRN: [id number]  Chief Complaint  Patient presents with  . Routine Post Op    PT stated that he is doing well he stated that his foot feels fine but stated that he did fall Sunday and landed on his foot     DOS: 11/20/2020 Procedure: Left foot flatfoot reconstruction with Strayer/Evans/Cotton  30 y.o. male returns for post-op check.  He returns sooner than normal because he fell on his foot while he was not wearing his boot and felt a pop  Review of Systems: Negative except as noted in the HPI. Denies N/V/F/Ch.   Objective:  There were no vitals filed for this visit. There is no height or weight on file to calculate BMI. Constitutional Well developed. Well nourished.  Vascular Foot warm and well perfused. Capillary refill normal to all digits.   Neurologic Normal speech. Oriented to person, place, and time. Epicritic sensation to light touch grossly present bilaterally.  Dermatologic  well-healed surgical incisions.  There is a spitting Vicryl suture in the proximal calf incision  Orthopedic:  Minimal tenderness to the surgical sites, minimal edema, no deformity   Radiographs: New radiographs taken today, no change in alignment from fall Assessment:   1. Post-operative state   2. Acquired pes planus, left   3. Sinus tarsi syndrome of left ankle   4. Gastrocnemius equinus of left lower extremity   5. Fall in home, initial encounter    Plan:  Patient was evaluated and treated and all questions answered.  S/p foot surgery left -I again encouraged him to be wearing the boot at all times other than bathing.  Emphasize importance of this -WB Status: NWB in high top CAM boot -Vicryl spitting suture removed -Keep Ace wrap around foot to manage edema  Return in 3 weeks (on 01/14/2021) for take new x-rays of both feet.

## 2021-01-14 ENCOUNTER — Encounter: Payer: 59 | Admitting: Podiatry

## 2021-01-18 ENCOUNTER — Ambulatory Visit (INDEPENDENT_AMBULATORY_CARE_PROVIDER_SITE_OTHER): Payer: 59

## 2021-01-18 ENCOUNTER — Ambulatory Visit (INDEPENDENT_AMBULATORY_CARE_PROVIDER_SITE_OTHER): Payer: 59 | Admitting: Podiatry

## 2021-01-18 ENCOUNTER — Other Ambulatory Visit: Payer: Self-pay

## 2021-01-18 DIAGNOSIS — M21862 Other specified acquired deformities of left lower leg: Secondary | ICD-10-CM

## 2021-01-18 DIAGNOSIS — M76821 Posterior tibial tendinitis, right leg: Secondary | ICD-10-CM

## 2021-01-18 DIAGNOSIS — M2142 Flat foot [pes planus] (acquired), left foot: Secondary | ICD-10-CM | POA: Diagnosis not present

## 2021-01-18 DIAGNOSIS — M2141 Flat foot [pes planus] (acquired), right foot: Secondary | ICD-10-CM | POA: Diagnosis not present

## 2021-01-18 DIAGNOSIS — M216X1 Other acquired deformities of right foot: Secondary | ICD-10-CM

## 2021-01-18 DIAGNOSIS — M21861 Other specified acquired deformities of right lower leg: Secondary | ICD-10-CM

## 2021-01-18 DIAGNOSIS — M216X2 Other acquired deformities of left foot: Secondary | ICD-10-CM

## 2021-01-18 DIAGNOSIS — M76822 Posterior tibial tendinitis, left leg: Secondary | ICD-10-CM

## 2021-01-18 DIAGNOSIS — Z9889 Other specified postprocedural states: Secondary | ICD-10-CM

## 2021-01-19 ENCOUNTER — Encounter: Payer: Self-pay | Admitting: Podiatry

## 2021-01-19 NOTE — Progress Notes (Signed)
  Subjective:  Patient ID: Chad Long, male    DOB: [birth year],  MRN: [id number]  Chief Complaint  Patient presents with  . Routine Post Op    NEW XRS BOTH FEET // POV #4 DOS 11/20/2020 LT FOOT FLAT FOOT RECONSTRUCTIION W/CALF MUSCLE LENGTHENING, CUT IN HEEL BONE, MIDFOOT, USE OF BONE GRAFT    DOS: 11/20/2020 Procedure: Left foot flatfoot reconstruction with Strayer/Evans/Cotton  30 y.o. male returns for post-op check.  He is doing well, having minimal pain, ready to plan surgery for June 4 the contralateral limb  Review of Systems: Negative except as noted in the HPI. Denies N/V/F/Ch.   Objective:  There were no vitals filed for this visit. There is no height or weight on file to calculate BMI. Constitutional Well developed. Well nourished.  Vascular Foot warm and well perfused. Capillary refill normal to all digits.   Neurologic Normal speech. Oriented to person, place, and time. Epicritic sensation to light touch grossly present bilaterally.  Dermatologic  well-healed surgical incisions.  Small suture abscess proximal lateral Evans incision  Orthopedic:  Minimal tenderness to the surgical sites, minimal edema, no deformity  On his right foot he has flexible flatfoot deformity gastrocnemius equinus   Radiographs: Radiographs taken today show good consolidation of the bony grafts on the left foot, his right foot has pes planus deformity with no appreciable changes as last visit with dorsiflexion of the first ray and heel valgus with forefoot abduction Assessment:   1. Pes planus of both feet   2. Gastrocnemius equinus of left lower extremity   3. Post-operative state    Plan:  Patient was evaluated and treated and all questions answered.  S/p foot surgery left -Doing quite well now 8 weeks out.  Begin Protected weightbearing in the CAM boot.  I also think he should begin physical therapy for conditioning and stretching range of motion of the gastrocnemius lengthening.   Referral was sent.  He is ready to plan for surgery on the contralateral side at this point, we discussed all risk, benefits and potential complications of this which he understands well having recently gone through it on the left side.  All questions were addressed.  Surgical we discussed Evans and cotton osteotomies with gastrocnemius Strayer lengthening.  Informed consent was signed and reviewed and will be scheduled for mid June   Surgical plan:  Procedure: -Plan for reconstruction right foot with Evans and cotton osteotomies of cadaveric graft, gastrocnemius Strayer recession  [facility]  Anesthesia plan: -General anesthesia with popliteal and saphenous regional block  Postoperative pain plan: - Tylenol 1000 mg every 6 hours, ibuprofen 600 mg every 6 hours, gabapentin 300 mg every 8 hours x5 days, oxycodone 5 mg 1-2 tabs every 6 hours only as needed  DVT prophylaxis: -ASA 325 mg twice daily  WB Restrictions / DME needs: -NWB in postop cast (he had issues falling on this previously so will be casting at this time)   Return in about 1 month (around 02/17/2021) for post op flat foot surgery, transition to boot .

## 2021-01-20 ENCOUNTER — Ambulatory Visit: Payer: 59 | Attending: Podiatry | Admitting: Physical Therapy

## 2021-01-20 DIAGNOSIS — M25572 Pain in left ankle and joints of left foot: Secondary | ICD-10-CM | POA: Insufficient documentation

## 2021-01-20 DIAGNOSIS — M25672 Stiffness of left ankle, not elsewhere classified: Secondary | ICD-10-CM | POA: Insufficient documentation

## 2021-01-20 DIAGNOSIS — M62831 Muscle spasm of calf: Secondary | ICD-10-CM | POA: Insufficient documentation

## 2021-01-20 DIAGNOSIS — M6281 Muscle weakness (generalized): Secondary | ICD-10-CM | POA: Insufficient documentation

## 2021-01-21 ENCOUNTER — Other Ambulatory Visit: Payer: Self-pay

## 2021-01-21 ENCOUNTER — Ambulatory Visit: Payer: 59 | Admitting: Physical Therapy

## 2021-01-21 ENCOUNTER — Encounter: Payer: Self-pay | Admitting: Physical Therapy

## 2021-01-21 DIAGNOSIS — M62831 Muscle spasm of calf: Secondary | ICD-10-CM | POA: Diagnosis present

## 2021-01-21 DIAGNOSIS — M25672 Stiffness of left ankle, not elsewhere classified: Secondary | ICD-10-CM

## 2021-01-21 DIAGNOSIS — M25572 Pain in left ankle and joints of left foot: Secondary | ICD-10-CM | POA: Diagnosis not present

## 2021-01-21 DIAGNOSIS — M6281 Muscle weakness (generalized): Secondary | ICD-10-CM

## 2021-01-21 NOTE — Therapy (Addendum)
[facility] [address]  [place], [place], [address] Phone: [phone]   Fax:  [phone]  Physical Therapy Evaluation / Discharge Summary  Patient Details  Name: Chad Long MRN: [id number] Date of Birth: [birth year] Referring Provider (PT): Lanae Crumbly, DPM   Encounter Date: 01/21/2021   PT End of Session - 01/21/21 1204    Visit Number 1    Number of Visits 12    Date for PT Re-Evaluation 03/04/21    Authorization Type [employer]    Progress Note Due on Visit 8   Return to doctors on 02/17/2021   PT Start Time 1017    PT Stop Time 1105    PT Time Calculation (min) 48 min    Activity Tolerance Patient tolerated treatment well;Patient limited by pain    Behavior During Therapy [facility] for tasks assessed/performed           Past Medical History:  Diagnosis Date  . Anxiety   . Depression   . Flat foot   . History of 2019 novel coronavirus disease (COVID-19) 09/17/2019   positive test result in epic, per pt had mild symptoms that resolved  . OSA (obstructive sleep apnea)    followed by pulmonologist---Llindsey Thornton Papas NP---  pt study in epic 06-22-2020 mild osa and pt titrated for cpap,  on 11-17-2020 pt stated his cpap is on order and have not received yet    Past Surgical History:  Procedure Laterality Date  . FINGER TENDON REPAIR Left 2010   index tendon repair from laceration  . FLAT FOOT RECONSTRUCTION-TAL GASTROC RECESSION Left 11/20/2020   Procedure: FLAT FOOT RECONSTRUCTION-TAL GASTROC RECESSION;  Surgeon: Criselda Peaches, DPM;  Location: [facility];  Service: Podiatry;  Laterality: Left;  WITH A BLOCK  . PILONIDAL CYST EXCISION  09-19-2013  @ [place]    There were no vitals filed for this visit.    Subjective Assessment - 01/21/21 1020    Subjective Pt. reports he is in PT following a L flat foot reconstruciton of his left foot to rebuild the arch; has gym equipment at his house  he can use. He does report L heel pain this morning that he notices when he puts pressure on it. Hasn't been able to work because of this and the pain he feels in his calf.    Limitations Standing;Walking;House hold activities    How long can you stand comfortably? Not long, around 30 minutes to an hour    How long can you walk comfortably? Does feel it when walking    Patient Stated Goals Strengthening his L lower leg and relieve some pressure in his L calf so he can get back to work and working out.    Currently in Pain? Yes    Pain Score 6     Pain Location Foot   Left heel   Pain Orientation Left    Pain Descriptors / Indicators Sharp    Pain Type Acute pain    Pain Onset Today    Pain Frequency Intermittent    Aggravating Factors  Walking on it; putting wieght on it or touching it    Pain Relieving Factors Rest and sometimes Tylenol as needed    Effect of Pain on Daily Activities Not able to do lower body workouts; not able to work.              [facility] PT Assessment - 01/21/21 1012  Assessment   Medical Diagnosis Pes Planus of both feet; Gastrocnemius equinas of L LE; Post-operative from Evans lateral column lengthening calcaneal osteotomy, Cotton tarsal osteotomy & Strayer gastrocnemius recession    Referring Provider (PT) Lanae Crumbly, DPM    Onset Date/Surgical Date 11/20/20    Hand Dominance Right    Next MD Visit 02/18/2021    Prior Therapy Previous therapy for his finger in 2010      Precautions   Precautions None    Precaution Comments WBAT    Required Braces or Orthoses Other Brace/Splint   Walking boot on Left   Other Brace/Splint Does have a scooter for L Leg      Restrictions   Weight Bearing Restrictions Yes    LLE Weight Bearing Weight bearing as tolerated      Balance Screen   Has the patient fallen in the past 6 months Yes   Has fallen 3-4 times; Fell after hitting a rock while on his scooter, moving too quickly with the boot.   How many times? 4     Has the patient had a decrease in activity level because of a fear of falling?  No    Is the patient reluctant to leave their home because of a fear of falling?  No      Home Environment   Living Environment Private residence    Living Arrangements Spouse/significant other;Children    Available Help at Discharge Family    Type of Jolly to enter   5 step into his garage   Entrance Stairs-Rails Can reach both    [place] Two level    Alternate Level Stairs-Number of Steps 15    Alternate Level Stairs-Rails Right    Home Equipment Crutches   Knee Scooter     Prior Function   Level of Independence Independent    Vocation Full time employment    Doctor, hospital; gets on ladders; is on knees; carries 5-gallon buckets; a ot of physical activity    Leisure Working out; Ship broker; watching sports; plays adult league baseball; drawing; Work provides physical activity.      Cognition   Overall Cognitive Status Within Functional Limits for tasks assessed      Observation/Other Assessments   Focus on Therapeutic Outcomes (FOTO)  L foot 46; predicted is 63      ROM / Strength   AROM / PROM / Strength AROM;Strength      AROM   Right Ankle Dorsiflexion 5    Right Ankle Plantar Flexion 35    Right Ankle Inversion 30    Right Ankle Eversion 20    Left Ankle Dorsiflexion 1    Left Ankle Plantar Flexion 36    Left Ankle Inversion 10    Left Ankle Eversion 3      Strength   Right Hip Flexion 5/5    Right Hip External Rotation  5/5    Right Hip Internal Rotation 5/5    Left Hip Flexion 4+/5    Left Hip External Rotation 4+/5    Left Hip Internal Rotation 5/5    Right Knee Flexion 5/5    Right Knee Extension 5/5    Left Knee Flexion 5/5    Left Knee Extension 5/5    Right Ankle Dorsiflexion 5/5    Right Ankle Plantar Flexion 4+/5    Right Ankle Inversion 5/5    Right Ankle Eversion 5/5    Left Ankle  Dorsiflexion 3-/5    Left Ankle  Plantar Flexion 2+/5    Left Ankle Inversion 3/5    Left Ankle Eversion 3/5      Ambulation/Gait   Ambulation/Gait Yes    Ambulation/Gait Assistance 5: Supervision    Ambulation Distance (Feet) 60 Feet    Assistive device --   Walking boot   Gait Pattern Step-through pattern;Decreased step length - left;Decreased stance time - left;Decreased stride length;Decreased dorsiflexion - left;Antalgic;Narrow base of support;Decreased weight shift to left;Lateral trunk lean to right    Ambulation Surface Level    Gait Comments More assessment next visit                      Objective measurements completed on examination: See above findings.       [facility] PT Treatment/Exercise - 01/21/21 1155      Ambulation/Gait   Ambulation/Gait Yes    Ambulation/Gait Assistance 5: Supervision    Ambulation Distance (Feet) 30 Feet    Assistive device R Axillary Crutch    Gait Pattern Step-to pattern    Ambulation Surface Level      Ankle Exercises: Stretches   Gastroc Stretch 1 rep;30 seconds    Gastroc Stretch Limitations Seated; limited by pain                  PT Education - 01/21/21 1203    Education Details Education provided on initial HEP access code [email or web address]; education and demonstration on single crutch use    Person(s) Educated Patient    Methods Explanation;Demonstration;Tactile cues;Verbal cues;Handout    Comprehension Tactile cues required;Verbal cues required;Returned demonstration;Verbalized understanding;Need further instruction            PT Short Term Goals - 01/21/21 1218      PT SHORT TERM GOAL #1   Title Patient will be independent with initial HEP    Status New    Target Date 02/11/21      PT SHORT TERM GOAL #2   Title Pt will improve AROM for B ankle DF by > / = 5 degrees to improve gait mechanics    Target Date 02/11/21             PT Long Term Goals - 01/21/21 1219      PT LONG TERM GOAL #1   Title Patient will be independent  with ongoing/advanced HEP +/- gym program for self-management at home    Target Date 03/04/21      PT LONG TERM GOAL #2   Title Pt will improve L ankle ROM to [facility] for improved functional mobility    Status New    Target Date 03/04/21      PT LONG TERM GOAL #3   Title Pt will demonstrate L ankle strength to > / = 4/5    Status New    Target Date 03/04/21      PT LONG TERM GOAL #4   Title Pt will report a pain improvement at rest of > / = 75% for return to work activities    Status New    Target Date 03/04/21      PT LONG TERM GOAL #5   Title Pt will score > / = 63 on the [place]    Target Date 03/04/21                  Plan - 01/21/21 1207    Clinical Impression Statement Quillian Quince  is 30yr old and was referred to outpatient PT following a L flatfoot reconstruction and L gastrocnemius lengthening. He reports his doctor wants him to get PT for strengthening his ankle and stretching surrounding muscles. He reports pain in his L heel around the incision site when he puts weight on his foot. He also has pain and tightness in his L calf. and reports a few falls since surgery when trying to move too fast in his walking boot or knee scooter. He presents with limited B ankle ROM specifically in DF with limitations in L>R and L ankle weakness. There is swelling in the L ankle as well. Pt will benefit from skilled PT to increase ROM in his L ankle and improve strength for return to function and work.    Personal Factors and Comorbidities Comorbidity 2;Profession    Comorbidities Anxiety and Depression    Examination-Activity Limitations Caring for Others;Carry;Lift;Stand;Stairs;Squat;Transfers    Examination-Participation Restrictions Occupation;Community Activity;Shop;Volunteer;Yard Work    Stability/Clinical Decision Making Stable/Uncomplicated    Clinical Decision Making Low    Rehab Potential Good    PT Frequency 2x / week    PT Duration 6 weeks    PT  Treatment/Interventions ADLs/Self Care Home Management;Cryotherapy;Electrical Stimulation;Iontophoresis 439mml Dexamethasone;Moist Heat;Ultrasound;Contrast Bath;Balance training;Therapeutic exercise;Therapeutic activities;Functional mobility training;Stair training;Gait training;Patient/family education;Manual techniques;Dry needling;Passive range of motion;Scar mobilization;Taping;Joint Manipulations;Vasopneumatic Device    PT Next Visit Plan Assess rest of hip MMT; Training with single crutch use; Progress ankle AROM; Strengthening exercises for ankle and LE as tolerated; Proprioceptive exercises as tolerated; assess stair navigation; STM/DTM prn; Joint mobilization for pain    PT Home Exercise Plan [id number]    Consulted and Agree with Plan of Care Patient           Patient will benefit from skilled therapeutic intervention in order to improve the following deficits and impairments:  Abnormal gait,Decreased coordination,Decreased range of motion,Difficulty walking,Increased fascial restricitons,Impaired tone,Decreased endurance,Increased muscle spasms,Decreased activity tolerance,Pain,Decreased balance,Hypomobility,Impaired flexibility,Improper body mechanics,Decreased mobility,Decreased strength,Increased edema  Visit Diagnosis: Pain in left ankle and joints of left foot  Stiffness of left ankle, not elsewhere classified  Muscle spasm of calf  Muscle weakness (generalized)     Problem List Patient Active Problem List   Diagnosis Date Noted  . Pes planus of left foot   . Sinus tarsi syndrome, left   . Gastrocnemius equinus, left     SiNewman NickelsPT 01/21/2021, 1:45 PM  [facility]one: [phone] Fax:  [phone]: DaAudley HinojosRN: [id number] of Birth: 1204-02-1991 PHYSICAL THERAPY DISCHARGE SUMMARY  Visits from Start of Care: 1  Current  functional level related to goals / functional outcomes:    Refer to above clinical impression for status as of eval on 01/21/2021. Patient failed to return to PT in >30 days, therefore will proceed with discharge from PT for this episode.    Remaining deficits:    As above.    Education / Equipment:    Initial HEP  Plan: Patient agrees to discharge.  Patient goals were not met. Patient is being discharged due to not returning since the last visit.  ?????     JoPercival SpanishPT, MPT 03/17/21, 9:10 AM  [facility]one: [phone] Fax:  [phone]

## 2021-01-21 NOTE — Patient Instructions (Signed)
  Access Code: [email or web address] URL: [email or web address] Date: 01/21/2021 Prepared by: Glenetta Hew  Exercises Seated Gastroc Stretch with Strap - 2 x daily - 7 x weekly - 1 sets - 2 reps - 30 sec hold Seated Soleus Stretch with Strap - 2 x daily - 7 x weekly - 1 sets - 2 reps - 30 sec hold

## 2021-01-27 ENCOUNTER — Ambulatory Visit: Payer: 59

## 2021-02-08 ENCOUNTER — Telehealth: Payer: Self-pay | Admitting: Podiatry

## 2021-02-08 NOTE — Telephone Encounter (Signed)
I spoke with him and we will take care of this for him

## 2021-02-08 NOTE — Telephone Encounter (Signed)
Patient called and stated that he needs a plan of care and all the details about how long he needs to be out of work and how long he needs to stay off his foot. Please call patient

## 2021-02-09 ENCOUNTER — Encounter: Payer: Self-pay | Admitting: Podiatry

## 2021-02-11 ENCOUNTER — Encounter: Payer: 59 | Admitting: Physical Therapy

## 2021-02-17 ENCOUNTER — Encounter: Payer: 59 | Admitting: Physical Therapy

## 2021-02-18 ENCOUNTER — Encounter: Payer: 59 | Admitting: Podiatry

## 2021-02-23 ENCOUNTER — Ambulatory Visit (INDEPENDENT_AMBULATORY_CARE_PROVIDER_SITE_OTHER): Payer: 59 | Admitting: Podiatry

## 2021-02-23 ENCOUNTER — Encounter: Payer: Self-pay | Admitting: Podiatry

## 2021-02-23 ENCOUNTER — Ambulatory Visit (INDEPENDENT_AMBULATORY_CARE_PROVIDER_SITE_OTHER): Payer: 59

## 2021-02-23 ENCOUNTER — Other Ambulatory Visit: Payer: Self-pay

## 2021-02-23 DIAGNOSIS — M216X1 Other acquired deformities of right foot: Secondary | ICD-10-CM | POA: Diagnosis not present

## 2021-02-23 DIAGNOSIS — M21862 Other specified acquired deformities of left lower leg: Secondary | ICD-10-CM

## 2021-02-23 DIAGNOSIS — M216X2 Other acquired deformities of left foot: Secondary | ICD-10-CM | POA: Diagnosis not present

## 2021-02-23 DIAGNOSIS — M2142 Flat foot [pes planus] (acquired), left foot: Secondary | ICD-10-CM

## 2021-02-23 DIAGNOSIS — M21861 Other specified acquired deformities of right lower leg: Secondary | ICD-10-CM

## 2021-02-23 NOTE — Progress Notes (Signed)
  Subjective:  Patient ID: Chad Long, male    DOB: [birth year],  MRN: [id number]  Chief Complaint  Patient presents with  . Routine Post Op      for post op flat foot surgery, transition to boot // POV #5 DOS 11/20/2020 LT FOOT FLAT FOOT RECONSTRUCTIION W/CALF MUSCLE LENGTHENING, CUT IN HEEL BONE, MIDFOOT, USE OF BONE GRAFT    DOS: 11/20/2020 Procedure: Left foot flatfoot reconstruction with Strayer/Evans/Cotton  30 y.o. male returns for post-op check.  Doing well pain is only intermittent at this point the left side Review of Systems: Negative except as noted in the HPI. Denies N/V/F/Ch.   Objective:  There were no vitals filed for this visit. There is no height or weight on file to calculate BMI. Constitutional Well developed. Well nourished.  Vascular Foot warm and well perfused. Capillary refill normal to all digits.   Neurologic Normal speech. Oriented to person, place, and time. Epicritic sensation to light touch grossly present bilaterally.  Dermatologic  well-healed surgical incisions.  Small suture abscess proximal lateral Evans incision  Orthopedic:  Minimal tenderness to the surgical sites, minimal edema, no deformity  On his right foot he has flexible flatfoot deformity gastrocnemius equinus   Radiographs: New radiographs taken today show good consolidation of the grafts Assessment:   1. Gastrocnemius equinus of left lower extremity   2. Acquired pes planus, left   3. Gastrocnemius equinus of right lower extremity    Plan:  Patient was evaluated and treated and all questions answered.  S/p foot surgery left -Doing well he is 3 months out from surgery now.  He may transition out of the CAM boot into regular shoe gear as tolerated.  Increase over the next few weeks before surgery so that he will be able to weight-bear on the side while he is nonweightbearing on the right side.  Surgery scheduled for June 10.  I will see him at surgery   Surgical  plan:  Procedure: -Plan for reconstruction right foot with Evans and cotton osteotomies of cadaveric graft, gastrocnemius Strayer recession  [facility]  Anesthesia plan: -General anesthesia with popliteal and saphenous regional block  Postoperative pain plan: - Tylenol 1000 mg every 6 hours, ibuprofen 600 mg every 6 hours, gabapentin 300 mg every 8 hours x5 days, oxycodone 5 mg 1-2 tabs every 6 hours only as needed  DVT prophylaxis: -ASA 325 mg twice daily  WB Restrictions / DME needs: -NWB in postop cast (he had issues falling on this previously so will be casting at this time)   Return for after surgery .

## 2021-02-25 ENCOUNTER — Encounter: Payer: 59 | Admitting: Physical Therapy

## 2021-03-01 ENCOUNTER — Telehealth: Payer: Self-pay | Admitting: Urology

## 2021-03-01 NOTE — Telephone Encounter (Signed)
DOS - 03/19/21  GASTROCNEMIUS RECESS RIGHT --- [id number] COTTON TARSAL OSTEOTOMY RIGHT --- [address] [facility] CALCANEAL OSTEOTOMY RIGHT --- [id number]  [facility] EFFECTIVE DATE - 01/09/20  RECEIVED FAX STATING FOR CPT CODES 09811, 91478 AND [address]00 NO PRIOR AUTH IS REQUIRED AND IT IS GOOD FROM 02/25/21 - 05/26/21.

## 2021-03-04 ENCOUNTER — Encounter: Payer: 59 | Admitting: Physical Therapy

## 2021-03-15 ENCOUNTER — Encounter: Payer: 59 | Admitting: Physical Therapy

## 2021-03-16 ENCOUNTER — Other Ambulatory Visit: Payer: Self-pay

## 2021-03-17 ENCOUNTER — Encounter (HOSPITAL_BASED_OUTPATIENT_CLINIC_OR_DEPARTMENT_OTHER): Payer: Self-pay | Admitting: Podiatry

## 2021-03-17 ENCOUNTER — Other Ambulatory Visit: Payer: Self-pay

## 2021-03-17 ENCOUNTER — Ambulatory Visit (INDEPENDENT_AMBULATORY_CARE_PROVIDER_SITE_OTHER): Payer: 59 | Admitting: Medical

## 2021-03-17 VITALS — BP 136/66 | HR 77 | Temp 98.5°F | Resp 18 | Ht 71.0 in | Wt 235.0 lb

## 2021-03-17 DIAGNOSIS — Z01818 Encounter for other preprocedural examination: Secondary | ICD-10-CM

## 2021-03-17 NOTE — Progress Notes (Signed)
 Subjective:    Patient ID: Chad Long, male    DOB: 12/20/1990, 30 y.o.   MRN: [id number]  HPI  Pt has hx of flat feet. Pt states feet always bothered him. He states when in [employer] dong PT feet would hurt.  Pt is scheduled to have surgery for second foot surgery. This time on rt side. Back in February he had surgery done on 11 th on left side. No complications. Surgery went well and has recovered.   No family history of sudden death. General anesthesia 3 times with no adverse reaction. Tendon laceration and large cyst in upper buttock. No cardiac or respiratory condition. No easy bruising.   FH- Dad- diabetes. Mom- alive and well. 1 brother and sister alive and well.   Ekg back in February was normal. Pt has no interval cardiac type signs or symptoms. No shortness of breath.  Pt last labs cbc and cmp preop labs normal except elevated eosinophils    Review of Systems  Constitutional: Negative for chills, fatigue and fever.  HENT: Negative for congestion.   Respiratory: Negative for cough, chest tightness, shortness of breath and wheezing.   Cardiovascular: Negative for chest pain and palpitations.  Gastrointestinal: Negative for abdominal distention, abdominal pain, blood in stool, diarrhea, nausea and vomiting.  Genitourinary: Negative for dysuria, flank pain, frequency and testicular pain.  Musculoskeletal: Negative for back pain and myalgias.  Skin: Negative for rash.  Neurological: Negative for dizziness, speech difficulty, weakness, numbness and headaches.  Hematological: Negative for adenopathy. Does not bruise/bleed easily.  Psychiatric/Behavioral: Negative for behavioral problems and confusion.     Past Medical History:  Diagnosis Date  . Anxiety   . Depression   . Flat foot   . History of 2019 novel coronavirus disease (COVID-19) 09/17/2019   positive test result in epic, per pt had mild symptoms that resolved  . OSA (obstructive sleep apnea)    followed by  pulmonologist---Llindsey Boles NP---  pt study in epic 06-22-2020 mild osa and pt titrated for cpap,  on 11-17-2020 pt stated his cpap is on order and have not received yet     Social History   Socioeconomic History  . Marital status: Married    Spouse name: Not on file  . Number of children: Not on file  . Years of education: Not on file  . Highest education level: Not on file  Occupational History  . Occupation: painter.  Tobacco Use  . Smoking status: Never Smoker  . Smokeless tobacco: Former User    Types: Chew  Vaping Use  . Vaping Use: Every day  . Start date: 02/07/2015  . Devices: Vuse  Substance and Sexual Activity  . Alcohol use: Yes    Alcohol/week: 12.0 standard drinks    Types: 12 Cans of beer per week  . Drug use: Not Currently    Comment: 11-17-2020  per pt last used teen  . Sexual activity: Yes  Other Topics Concern  . Not on file  Social History Narrative  . Not on file   Social Determinants of Health   Financial Resource Strain: Not on file  Food Insecurity: Not on file  Transportation Needs: Not on file  Physical Activity: Not on file  Stress: Not on file  Social Connections: Not on file  Intimate Partner Violence: Not on file    Past Surgical History:  Procedure Laterality Date  . FINGER TENDON REPAIR Left 2010   index tendon repair from laceration  . FLAT FOOT   RECONSTRUCTION-TAL GASTROC RECESSION Left 11/20/2020   Procedure: FLAT FOOT RECONSTRUCTION-TAL GASTROC RECESSION;  Surgeon: Edwin Cap, DPM;  Location: [facility];  Service: Podiatry;  Laterality: Left;  WITH A BLOCK  . PILONIDAL CYST EXCISION  09-19-2013  @ [facility]    Family History  Problem Relation Age of Onset  . Diabetes Father   . Diabetes Paternal Aunt   . Diabetes Paternal Grandmother     No Known Allergies  No current outpatient medications on file prior to visit.   No current facility-administered medications on file prior to visit.    BP 136/66    Pulse 77   Temp 98.5 F (36.9 C)   Resp 18   Ht 5\' 11"  (1.803 m)   Wt 235 lb (106.6 kg)   SpO2 95%   BMI 32.78 kg/m       Objective:   Physical Exam  General Mental Status- Alert. General Appearance- Not in acute distress.   Skin General: Color- Normal Color. Moisture- Normal Moisture.  Neck Carotid Arteries- Normal color. Moisture- Normal Moisture. No carotid bruits. No JVD.  Chest and Lung Exam Auscultation: Breath Sounds:-Normal.  Cardiovascular Auscultation:Rythm- Regular. Murmurs & Other Heart Sounds:Auscultation of the heart reveals- No Murmurs.  Abdomen Inspection:-Inspeection Normal. Palpation/Percussion:Note:No mass. Palpation and Percussion of the abdomen reveal- Non Tender, Non Distended + BS, no rebound or guarding.    Neurologic Cranial Nerve exam:- CN III-XII intact(No nystagmus), symmetric smile. Strength:- 5/5 equal and symmetric strength both upper and lower extremities.   Rt foot- appears flat foot. Left foot- appears has arch now.    Assessment & Plan:  Young overall healthy patient with no interval change in health since feb 2022 when had left side foot surgery for flat feet. Reviewed cbc, cmp and ekg in February. No significant abnormality. Vitals stables and physical negative except rt foot.  Filled out form and cleared for surgery this Friday.  Follow up as needed.  Friday, PA-C

## 2021-03-17 NOTE — H&P (View-Only) (Signed)
Subjective:    Patient ID: Chad Long, male    DOB: Jul 09, 1991, 30 y.o.   MRN: [id number]  HPI  Pt has hx of flat feet. Pt states feet always bothered him. He states when in [employer] PT feet would hurt.  Pt is scheduled to have surgery for second foot surgery. This time on rt side. Back in February he had surgery done on 11 th on left side. No complications. Surgery went well and has recovered.   No family history of sudden death. General anesthesia 3 times with no adverse reaction. Tendon laceration and large cyst in upper buttock. No cardiac or respiratory condition. No easy bruising.   FH- Dad- diabetes. Mom- alive and well. 1 brother and sister alive and well.   Ekg back in February was normal. Pt has no interval cardiac type signs or symptoms. No shortness of breath.  Pt last labs cbc and cmp preop labs normal except elevated eosinophils    Review of Systems  Constitutional: Negative for chills, fatigue and fever.  HENT: Negative for congestion.   Respiratory: Negative for cough, chest tightness, shortness of breath and wheezing.   Cardiovascular: Negative for chest pain and palpitations.  Gastrointestinal: Negative for abdominal distention, abdominal pain, blood in stool, diarrhea, nausea and vomiting.  Genitourinary: Negative for dysuria, flank pain, frequency and testicular pain.  Musculoskeletal: Negative for back pain and myalgias.  Skin: Negative for rash.  Neurological: Negative for dizziness, speech difficulty, weakness, numbness and headaches.  Hematological: Negative for adenopathy. Does not bruise/bleed easily.  Psychiatric/Behavioral: Negative for behavioral problems and confusion.     Past Medical History:  Diagnosis Date  . Anxiety   . Depression   . Flat foot   . History of 2019 novel coronavirus disease (COVID-19) 09/17/2019   positive test result in epic, per pt had mild symptoms that resolved  . OSA (obstructive sleep apnea)    followed by  pulmonologist---Llindsey Tyron Russell NP---  pt study in epic 06-22-2020 mild osa and pt titrated for cpap,  on 11-17-2020 pt stated his cpap is on order and have not received yet     Social History   Socioeconomic History  . Marital status: Married    Spouse name: Not on file  . Number of children: Not on file  . Years of education: Not on file  . Highest education level: Not on file  Occupational History  . Occupation: Education administrator.  Tobacco Use  . Smoking status: Never Smoker  . Smokeless tobacco: Former Neurosurgeon    Types: Engineer, drilling  . Vaping Use: Every day  . Start date: 02/07/2015  . Devices: Vuse  Substance and Sexual Activity  . Alcohol use: Yes    Alcohol/week: 12.0 standard drinks    Types: 12 Cans of beer per week  . Drug use: Not Currently    Comment: 11-17-2020  per pt last used teen  . Sexual activity: Yes  Other Topics Concern  . Not on file  Social History Narrative  . Not on file   Social Determinants of Health   Financial Resource Strain: Not on file  Food Insecurity: Not on file  Transportation Needs: Not on file  Physical Activity: Not on file  Stress: Not on file  Social Connections: Not on file  Intimate Partner Violence: Not on file    Past Surgical History:  Procedure Laterality Date  . FINGER TENDON REPAIR Left 2010   index tendon repair from laceration  . FLAT FOOT  RECONSTRUCTION-TAL GASTROC RECESSION Left 11/20/2020   Procedure: FLAT FOOT RECONSTRUCTION-TAL GASTROC RECESSION;  Surgeon: Edwin Cap, DPM;  Location: [facility];  Service: Podiatry;  Laterality: Left;  WITH A BLOCK  . PILONIDAL CYST EXCISION  09-19-2013  @ [facility]    Family History  Problem Relation Age of Onset  . Diabetes Father   . Diabetes Paternal Aunt   . Diabetes Paternal Grandmother     No Known Allergies  No current outpatient medications on file prior to visit.   No current facility-administered medications on file prior to visit.    BP 136/66    Pulse 77   Temp 98.5 F (36.9 C)   Resp 18   Ht 5\' 11"  (1.803 m)   Wt 235 lb (106.6 kg)   SpO2 95%   BMI 32.78 kg/m       Objective:   Physical Exam  General Mental Status- Alert. General Appearance- Not in acute distress.   Skin General: Color- Normal Color. Moisture- Normal Moisture.  Neck Carotid Arteries- Normal color. Moisture- Normal Moisture. No carotid bruits. No JVD.  Chest and Lung Exam Auscultation: Breath Sounds:-Normal.  Cardiovascular Auscultation:Rythm- Regular. Murmurs & Other Heart Sounds:Auscultation of the heart reveals- No Murmurs.  Abdomen Inspection:-Inspeection Normal. Palpation/Percussion:Note:No mass. Palpation and Percussion of the abdomen reveal- Non Tender, Non Distended + BS, no rebound or guarding.    Neurologic Cranial Nerve exam:- CN III-XII intact(No nystagmus), symmetric smile. Strength:- 5/5 equal and symmetric strength both upper and lower extremities.   Rt foot- appears flat foot. Left foot- appears has arch now.    Assessment & Plan:  Young overall healthy patient with no interval change in health since feb 2022 when had left side foot surgery for flat feet. Reviewed cbc, cmp and ekg in February. No significant abnormality. Vitals stables and physical negative except rt foot.  Filled out form and cleared for surgery this Friday.  Follow up as needed.  Friday, PA-C

## 2021-03-17 NOTE — Progress Notes (Signed)
Spoke w/ via phone for pre-op interview--- Pt Lab needs dos---- no              Lab results------ no COVID test -----patient states asymptomatic no test needed Arrive at ------- 0530 on 03-19-2021 NPO after MN NO Solid Food.  Clear liquids from MN until--- 0430 Med rec completed Medications to take morning of surgery ----- none Diabetic medication ----- N/A Patient instructed no nail polish to be worn day of surgery Patient instructed to bring photo id and insurance card day of surgery Patient aware to have Driver (ride ) / caregiver    for 24 hours after surgery -- wife, Shanda Bumps Patient Special Instructions ----- n/a Pre-Op special Istructions ----- received pt's pcp, Esperanza Richters PA, H&P dated 03-17-2021 via fax from Dr Lilian Kapur office, placed w/ chart Patient verbalized understanding of instructions that were given at this phone interview. Patient denies shortness of breath, chest pain, fever, cough at this phone interview.

## 2021-03-17 NOTE — Patient Instructions (Signed)
Young overall healthy patient with no interval change in health since feb 2022 when had left side foot surgery for flat feet. Reviewed cbc, cmp and ekg in February. No significant abnormality. Vitals stables and physical negative except rt foot.  Filled out form and cleared for surgery this Friday.  Follow up as needed.

## 2021-03-18 NOTE — Anesthesia Preprocedure Evaluation (Addendum)
Anesthesia Evaluation  Patient identified by MRN, date of birth, ID band Patient awake    Reviewed: Allergy & Precautions, NPO status , Patient's Chart, lab work & pertinent test results  History of Anesthesia Complications Negative for: history of anesthetic complications  Airway Mallampati: II  TM Distance: >3 FB Neck ROM: Full    Dental no notable dental hx. (+) Dental Advisory Given, Teeth Intact   Pulmonary sleep apnea ,    Pulmonary exam normal breath sounds clear to auscultation       Cardiovascular negative cardio ROS Normal cardiovascular exam Rhythm:Regular Rate:Normal     Neuro/Psych PSYCHIATRIC DISORDERS Anxiety Depression negative neurological ROS     GI/Hepatic negative GI ROS, Neg liver ROS,   Endo/Other  BMI 33  Renal/GU negative Renal ROS  negative genitourinary   Musculoskeletal Left flat foot   Abdominal (+) + obese,   Peds  Hematology negative hematology ROS (+)   Anesthesia Other Findings Day of surgery medications reviewed with patient.  Reproductive/Obstetrics negative OB ROS                           Anesthesia Physical  Anesthesia Plan  ASA: 2  Anesthesia Plan: General   Post-op Pain Management: GA combined w/ Regional for post-op pain   Induction: Intravenous  PONV Risk Score and Plan: 2 and Treatment may vary due to age or medical condition, Ondansetron, Dexamethasone, Midazolam and Diphenhydramine  Airway Management Planned: LMA  Additional Equipment: None  Intra-op Plan:   Post-operative Plan: Extubation in OR  Informed Consent: I have reviewed the patients History and Physical, chart, labs and discussed the procedure including the risks, benefits and alternatives for the proposed anesthesia with the patient or authorized representative who has indicated his/her understanding and acceptance.     Dental advisory given  Plan Discussed with:  CRNA  Anesthesia Plan Comments:        Anesthesia Quick Evaluation

## 2021-03-19 ENCOUNTER — Ambulatory Visit (HOSPITAL_BASED_OUTPATIENT_CLINIC_OR_DEPARTMENT_OTHER)
Admission: RE | Admit: 2021-03-19 | Discharge: 2021-03-19 | Disposition: A | Discharge status: Home / Self Care | Payer: 59 | Attending: Podiatry | Admitting: Podiatry

## 2021-03-19 ENCOUNTER — Encounter (HOSPITAL_BASED_OUTPATIENT_CLINIC_OR_DEPARTMENT_OTHER): Payer: Self-pay | Admitting: Podiatry

## 2021-03-19 ENCOUNTER — Encounter (HOSPITAL_BASED_OUTPATIENT_CLINIC_OR_DEPARTMENT_OTHER)
Admission: RE | Disposition: A | Discharge status: Home / Self Care | Payer: Self-pay | Source: Home / Self Care | Attending: Podiatry

## 2021-03-19 ENCOUNTER — Other Ambulatory Visit: Payer: Self-pay

## 2021-03-19 ENCOUNTER — Ambulatory Visit (HOSPITAL_BASED_OUTPATIENT_CLINIC_OR_DEPARTMENT_OTHER): Payer: 59 | Admitting: Anesthesiology

## 2021-03-19 DIAGNOSIS — M2141 Flat foot [pes planus] (acquired), right foot: Secondary | ICD-10-CM

## 2021-03-19 DIAGNOSIS — M25571 Pain in right ankle and joints of right foot: Secondary | ICD-10-CM | POA: Diagnosis not present

## 2021-03-19 DIAGNOSIS — M216X1 Other acquired deformities of right foot: Secondary | ICD-10-CM

## 2021-03-19 DIAGNOSIS — Z8616 Personal history of COVID-19: Secondary | ICD-10-CM | POA: Diagnosis not present

## 2021-03-19 DIAGNOSIS — M2142 Flat foot [pes planus] (acquired), left foot: Secondary | ICD-10-CM | POA: Insufficient documentation

## 2021-03-19 DIAGNOSIS — M24573 Contracture, unspecified ankle: Secondary | ICD-10-CM | POA: Diagnosis not present

## 2021-03-19 HISTORY — PX: FLAT FOOT RECONSTRUCTION-TAL GASTROC RECESSION: SHX6620

## 2021-03-19 HISTORY — DX: Allergic rhinitis, unspecified: J30.9

## 2021-03-19 SURGERY — FLAT FOOT RECONSTRUCTION-TAL GASTROC RECESSION
Anesthesia: General | Site: Leg Lower | Laterality: Right

## 2021-03-19 MED ORDER — DEXMEDETOMIDINE (PRECEDEX) IN NS 20 MCG/5ML (4 MCG/ML) IV SYRINGE
PREFILLED_SYRINGE | INTRAVENOUS | Status: DC | PRN
  Administered 2021-03-19: 12 ug via INTRAVENOUS
  Administered 2021-03-19 (×3): 8 ug via INTRAVENOUS

## 2021-03-19 MED ORDER — OXYCODONE HCL 5 MG PO TABS
5.0000 mg | ORAL_TABLET | Freq: Once | ORAL | Status: AC
  Administered 2021-03-19: 5 mg via ORAL

## 2021-03-19 MED ORDER — FENTANYL CITRATE (PF) 100 MCG/2ML IJ SOLN
INTRAMUSCULAR | Status: AC
  Filled 2021-03-19: qty 2

## 2021-03-19 MED ORDER — ONDANSETRON HCL 4 MG/2ML IJ SOLN
INTRAMUSCULAR | Status: AC
  Filled 2021-03-19: qty 2

## 2021-03-19 MED ORDER — BUPIVACAINE-EPINEPHRINE (PF) 0.5% -1:200000 IJ SOLN
INTRAMUSCULAR | Status: DC | PRN
  Administered 2021-03-19: 25 mL via PERINEURAL
  Administered 2021-03-19: 15 mL via PERINEURAL

## 2021-03-19 MED ORDER — LIDOCAINE HCL (PF) 2 % IJ SOLN
INTRAMUSCULAR | Status: AC
  Filled 2021-03-19: qty 5

## 2021-03-19 MED ORDER — PHENYLEPHRINE 40 MCG/ML (10ML) SYRINGE FOR IV PUSH (FOR BLOOD PRESSURE SUPPORT)
PREFILLED_SYRINGE | INTRAVENOUS | Status: AC
  Filled 2021-03-19: qty 30

## 2021-03-19 MED ORDER — PHENYLEPHRINE HCL (PRESSORS) 10 MG/ML IV SOLN
INTRAVENOUS | Status: AC
  Filled 2021-03-19: qty 1

## 2021-03-19 MED ORDER — DEXAMETHASONE SODIUM PHOSPHATE 10 MG/ML IJ SOLN
INTRAMUSCULAR | Status: DC | PRN
  Administered 2021-03-19: 10 mg via INTRAVENOUS

## 2021-03-19 MED ORDER — OXYCODONE HCL 10 MG PO TABS: 10.0000 mg | ORAL_TABLET | Freq: Four times a day (QID) | ORAL | 0 refills | Status: DC | PRN

## 2021-03-19 MED ORDER — PHENYLEPHRINE 40 MCG/ML (10ML) SYRINGE FOR IV PUSH (FOR BLOOD PRESSURE SUPPORT)
PREFILLED_SYRINGE | INTRAVENOUS | Status: DC | PRN
  Administered 2021-03-19 (×3): 80 ug via INTRAVENOUS
  Administered 2021-03-19: 120 ug via INTRAVENOUS

## 2021-03-19 MED ORDER — PHENYLEPHRINE HCL-NACL 10-0.9 MG/250ML-% IV SOLN
INTRAVENOUS | Status: DC | PRN
  Administered 2021-03-19: 50 ug/min via INTRAVENOUS

## 2021-03-19 MED ORDER — MEPERIDINE HCL 25 MG/ML IJ SOLN
6.2500 mg | INTRAMUSCULAR | Status: DC | PRN
Start: 2021-03-19 — End: 2021-03-19

## 2021-03-19 MED ORDER — EPHEDRINE 5 MG/ML INJ
INTRAVENOUS | Status: AC
  Filled 2021-03-19: qty 10

## 2021-03-19 MED ORDER — GABAPENTIN 300 MG PO CAPS: 300.0000 mg | ORAL_CAPSULE | Freq: Three times a day (TID) | ORAL | 0 refills | Status: DC

## 2021-03-19 MED ORDER — PROPOFOL 10 MG/ML IV BOLUS
INTRAVENOUS | Status: DC | PRN
  Administered 2021-03-19 (×2): 200 mg via INTRAVENOUS
  Administered 2021-03-19: 100 mg via INTRAVENOUS

## 2021-03-19 MED ORDER — MIDAZOLAM HCL 2 MG/2ML IJ SOLN
INTRAMUSCULAR | Status: AC
  Filled 2021-03-19: qty 2

## 2021-03-19 MED ORDER — OXYCODONE HCL 5 MG PO TABS
ORAL_TABLET | ORAL | Status: AC
  Filled 2021-03-19: qty 1

## 2021-03-19 MED ORDER — CEFAZOLIN SODIUM-DEXTROSE 2-4 GM/100ML-% IV SOLN
2.0000 g | INTRAVENOUS | Status: AC
  Administered 2021-03-19: 2 g via INTRAVENOUS

## 2021-03-19 MED ORDER — CLONIDINE HCL (ANALGESIA) 100 MCG/ML EP SOLN
EPIDURAL | Status: DC | PRN
  Administered 2021-03-19: 33 ug
  Administered 2021-03-19: 67 ug

## 2021-03-19 MED ORDER — DEXAMETHASONE SODIUM PHOSPHATE 10 MG/ML IJ SOLN
INTRAMUSCULAR | Status: AC
  Filled 2021-03-19: qty 1

## 2021-03-19 MED ORDER — LIDOCAINE HCL (CARDIAC) PF 100 MG/5ML IV SOSY
PREFILLED_SYRINGE | INTRAVENOUS | Status: DC | PRN
  Administered 2021-03-19: 30 mg via INTRAVENOUS

## 2021-03-19 MED ORDER — LACTATED RINGERS IV SOLN
INTRAVENOUS | Status: DC
  Administered 2021-03-19: 1000 mL via INTRAVENOUS

## 2021-03-19 MED ORDER — FENTANYL CITRATE (PF) 100 MCG/2ML IJ SOLN: 25.0000 ug | INTRAMUSCULAR | Status: DC | PRN

## 2021-03-19 MED ORDER — FENTANYL CITRATE (PF) 100 MCG/2ML IJ SOLN
INTRAMUSCULAR | Status: DC | PRN
  Administered 2021-03-19 (×2): 50 ug via INTRAVENOUS

## 2021-03-19 MED ORDER — MIDAZOLAM HCL 5 MG/5ML IJ SOLN
INTRAMUSCULAR | Status: DC | PRN
  Administered 2021-03-19 (×2): 1 mg via INTRAVENOUS

## 2021-03-19 MED ORDER — IBUPROFEN 600 MG PO TABS
600.0000 mg | ORAL_TABLET | Freq: Three times a day (TID) | ORAL | 0 refills | Status: DC | PRN
Start: 2021-03-19 — End: 2021-03-30

## 2021-03-19 MED ORDER — DEXMEDETOMIDINE (PRECEDEX) IN NS 20 MCG/5ML (4 MCG/ML) IV SYRINGE
PREFILLED_SYRINGE | INTRAVENOUS | Status: AC
  Filled 2021-03-19: qty 10

## 2021-03-19 MED ORDER — PROMETHAZINE HCL 25 MG/ML IJ SOLN: 6.2500 mg | INTRAMUSCULAR | Status: DC | PRN

## 2021-03-19 MED ORDER — MIDAZOLAM HCL 2 MG/2ML IJ SOLN
2.0000 mg | Freq: Once | INTRAMUSCULAR | Status: AC
  Administered 2021-03-19: 2 mg via INTRAVENOUS

## 2021-03-19 MED ORDER — OXYCODONE HCL 5 MG PO TABS: 5.0000 mg | ORAL_TABLET | ORAL | 0 refills | Status: DC | PRN

## 2021-03-19 MED ORDER — CEFAZOLIN SODIUM-DEXTROSE 2-4 GM/100ML-% IV SOLN
INTRAVENOUS | Status: AC
  Filled 2021-03-19: qty 100

## 2021-03-19 MED ORDER — ONDANSETRON HCL 4 MG/2ML IJ SOLN
INTRAMUSCULAR | Status: DC | PRN
  Administered 2021-03-19: 4 mg via INTRAVENOUS

## 2021-03-19 MED ORDER — DEXAMETHASONE SODIUM PHOSPHATE 4 MG/ML IJ SOLN
INTRAMUSCULAR | Status: DC | PRN
  Administered 2021-03-19: 3 mg via PERINEURAL
  Administered 2021-03-19: 2 mg via PERINEURAL

## 2021-03-19 MED ORDER — ACETAMINOPHEN 500 MG PO TABS: 1000.0000 mg | ORAL_TABLET | Freq: Four times a day (QID) | ORAL | 0 refills | Status: AC | PRN

## 2021-03-19 MED ORDER — FENTANYL CITRATE (PF) 100 MCG/2ML IJ SOLN
50.0000 ug | Freq: Once | INTRAMUSCULAR | Status: AC
  Administered 2021-03-19: 50 ug via INTRAVENOUS

## 2021-03-19 MED ORDER — PROPOFOL 500 MG/50ML IV EMUL
INTRAVENOUS | Status: AC
  Filled 2021-03-19: qty 50

## 2021-03-19 MED ORDER — ASPIRIN EC 325 MG PO TBEC: 325.0000 mg | DELAYED_RELEASE_TABLET | Freq: Two times a day (BID) | ORAL | 0 refills | Status: AC

## 2021-03-19 MED ORDER — EPHEDRINE SULFATE-NACL 50-0.9 MG/10ML-% IV SOSY
PREFILLED_SYRINGE | INTRAVENOUS | Status: DC | PRN
  Administered 2021-03-19 (×3): 10 mg via INTRAVENOUS

## 2021-03-19 SURGICAL SUPPLY — 87 items
3M SCOTCHCAST ×6 IMPLANT
ALLOGRAFT COTTON WEDGE 6X24X14 (Orthopedic Graft) ×3 IMPLANT
ALLOGRAFT EVANS WEDGE 8X22X20 (Orthopedic Graft) ×3 IMPLANT
BLADE AVERAGE 25MMX9MM (BLADE) ×1
BLADE AVERAGE 25X9 (BLADE) ×2 IMPLANT
BLADE OSCILLATING/SAGITTAL (BLADE) ×3
BLADE SURG 15 STRL LF DISP TIS (BLADE) ×2 IMPLANT
BLADE SURG 15 STRL SS (BLADE) ×6
BLADE SW THK.38XMED LNG THN (BLADE) ×1 IMPLANT
BNDG CMPR 9X4 STRL LF SNTH (GAUZE/BANDAGES/DRESSINGS) ×1
BNDG CMPR MED 15X6 ELC VLCR LF (GAUZE/BANDAGES/DRESSINGS) ×1
BNDG CONFORM 2 STRL LF (GAUZE/BANDAGES/DRESSINGS) ×3 IMPLANT
BNDG ELASTIC 4X5.8 VLCR STR LF (GAUZE/BANDAGES/DRESSINGS) ×3 IMPLANT
BNDG ELASTIC 6X15 VLCR STRL LF (GAUZE/BANDAGES/DRESSINGS) ×3 IMPLANT
BNDG ELASTIC 6X5.8 VLCR STR LF (GAUZE/BANDAGES/DRESSINGS) IMPLANT
BNDG ESMARK 4X9 LF (GAUZE/BANDAGES/DRESSINGS) ×3 IMPLANT
BNDG GAUZE ELAST 4 BULKY (GAUZE/BANDAGES/DRESSINGS) ×3 IMPLANT
CLOSURE WOUND 1/2 X4 (GAUZE/BANDAGES/DRESSINGS)
COVER BACK TABLE 60X90IN (DRAPES) ×3 IMPLANT
COVER WAND RF STERILE (DRAPES) ×3 IMPLANT
CUFF TOURN SGL QUICK 18X4 (TOURNIQUET CUFF) IMPLANT
CUFF TOURN SGL QUICK 34 (TOURNIQUET CUFF) ×3
CUFF TRNQT CYL 34X4.125X (TOURNIQUET CUFF) ×1 IMPLANT
DRAPE C-ARM 35X43 STRL (DRAPES) ×3 IMPLANT
DRAPE C-ARM 42X120 X-RAY (DRAPES) ×3 IMPLANT
DRAPE C-ARMOR (DRAPES) IMPLANT
DRAPE EXTREMITY T 121X128X90 (DISPOSABLE) ×3 IMPLANT
DRAPE IMP U-DRAPE 54X76 (DRAPES) ×3 IMPLANT
DRAPE SHEET LG 3/4 BI-LAMINATE (DRAPES) ×3 IMPLANT
DRAPE U-SHAPE 47X51 STRL (DRAPES) ×3 IMPLANT
DRSG ADAPTIC 3X8 NADH LF (GAUZE/BANDAGES/DRESSINGS) IMPLANT
DRSG EMULSION OIL 3X3 NADH (GAUZE/BANDAGES/DRESSINGS) ×3 IMPLANT
DRSG PAD ABDOMINAL 8X10 ST (GAUZE/BANDAGES/DRESSINGS) ×3 IMPLANT
DURAPREP 26ML APPLICATOR (WOUND CARE) ×3 IMPLANT
ELECT REM PT RETURN 9FT ADLT (ELECTROSURGICAL) ×3
ELECTRODE REM PT RTRN 9FT ADLT (ELECTROSURGICAL) ×1 IMPLANT
GAUZE 4X4 16PLY RFD (DISPOSABLE) IMPLANT
GAUZE SPONGE 4X4 12PLY STRL (GAUZE/BANDAGES/DRESSINGS) ×3 IMPLANT
GAUZE XEROFORM 1X8 LF (GAUZE/BANDAGES/DRESSINGS) ×3 IMPLANT
GLOVE SRG 8 PF TXTR STRL LF DI (GLOVE) ×1 IMPLANT
GLOVE SURG ENC MOIS LTX SZ7 (GLOVE) ×9 IMPLANT
GLOVE SURG POLYISO LF SZ7 (GLOVE) ×3 IMPLANT
GLOVE SURG POLYISO LF SZ8 (GLOVE) ×3 IMPLANT
GLOVE SURG UNDER POLY LF SZ7 (GLOVE) ×3 IMPLANT
GLOVE SURG UNDER POLY LF SZ7.5 (GLOVE) ×12 IMPLANT
GLOVE SURG UNDER POLY LF SZ8 (GLOVE) ×3
GOWN STRL REUS W/TWL LRG LVL3 (GOWN DISPOSABLE) ×6 IMPLANT
GOWN STRL REUS W/TWL XL LVL3 (GOWN DISPOSABLE) ×3 IMPLANT
KIT TURNOVER CYSTO (KITS) ×3 IMPLANT
NDL SAFETY ECLIPSE 18X1.5 (NEEDLE) IMPLANT
NEEDLE HYPO 18GX1.5 SHARP (NEEDLE)
NEEDLE HYPO 25X1 1.5 SAFETY (NEEDLE) ×3 IMPLANT
NS IRRIG 1000ML POUR BTL (IV SOLUTION) ×3 IMPLANT
PACK BASIN DAY SURGERY FS (CUSTOM PROCEDURE TRAY) ×3 IMPLANT
PAD CAST 4YDX4 CTTN HI CHSV (CAST SUPPLIES) ×1 IMPLANT
PADDING CAST ABS 4INX4YD NS (CAST SUPPLIES) ×2
PADDING CAST ABS COTTON 4X4 ST (CAST SUPPLIES) ×1 IMPLANT
PADDING CAST COTTON 4X4 STRL (CAST SUPPLIES) ×3
PADDING CAST COTTON 6X4 STRL (CAST SUPPLIES) ×3 IMPLANT
PENCIL SMOKE EVACUATOR (MISCELLANEOUS) ×3 IMPLANT
SCOTCHCAST PLUS ×3 IMPLANT
SPLINT FIBERGLASS 4X30 (CAST SUPPLIES) IMPLANT
STOCKINETTE 6  STRL (DRAPES) ×2
STOCKINETTE 6 STRL (DRAPES) ×1 IMPLANT
STRIP CLOSURE SKIN 1/2X4 (GAUZE/BANDAGES/DRESSINGS) IMPLANT
SUCTION FRAZIER HANDLE 10FR (MISCELLANEOUS) ×2
SUCTION TUBE FRAZIER 10FR DISP (MISCELLANEOUS) ×1 IMPLANT
SUT ETHILON 3 0 PS 1 (SUTURE) ×9 IMPLANT
SUT MERSILENE 2.0 SH NDLE (SUTURE) ×3 IMPLANT
SUT MERSILENE 3 0 FS 1 (SUTURE) IMPLANT
SUT MNCRL AB 3-0 PS2 18 (SUTURE) ×3 IMPLANT
SUT MNCRL AB 4-0 PS2 18 (SUTURE) IMPLANT
SUT MON AB 2-0 SH 27 (SUTURE) ×6
SUT MON AB 2-0 SH27 (SUTURE) ×2 IMPLANT
SUT MON AB 3-0 SH 27 (SUTURE) ×6
SUT MON AB 3-0 SH27 (SUTURE) ×2 IMPLANT
SUT MON AB 4-0 PS1 27 (SUTURE) IMPLANT
SUT MON AB 5-0 PS2 18 (SUTURE) ×3 IMPLANT
SUT PROLENE 3 0 PS 2 (SUTURE) ×3 IMPLANT
SUT VIC AB 2-0 SH 27 (SUTURE)
SUT VIC AB 2-0 SH 27XBRD (SUTURE) IMPLANT
SYR 10ML LL (SYRINGE) IMPLANT
SYR BULB EAR ULCER 3OZ GRN STR (SYRINGE) ×6 IMPLANT
TOWEL OR 17X26 10 PK STRL BLUE (TOWEL DISPOSABLE) ×3 IMPLANT
TUBE CONNECTING 12'X1/4 (SUCTIONS) ×1
TUBE CONNECTING 12X1/4 (SUCTIONS) ×2 IMPLANT
UNDERPAD 30X36 HEAVY ABSORB (UNDERPADS AND DIAPERS) ×3 IMPLANT

## 2021-03-19 NOTE — Anesthesia Procedure Notes (Signed)
Anesthesia Regional Block: Adductor canal block   Pre-Anesthetic Checklist: , timeout performed,  Correct Patient, Correct Site, Correct Laterality,  Correct Procedure, Correct Position, site marked,  Risks and benefits discussed,  Surgical consent,  Pre-op evaluation,  Post-op pain management  Laterality: Right  Prep: chloraprep       Needles:  Injection technique: Single-shot  Needle Type: Stimiplex     Needle Length: 9cm  Needle Gauge: 21     Additional Needles:   Procedures:,,,, ultrasound used (permanent image in chart),,    Narrative:  Start time: 03/19/2021 7:05 AM End time: 03/19/2021 7:15 AM Injection made incrementally with aspirations every 5 mL.  Performed by: Personally  Anesthesiologist: Trasean Delima, MD  Additional Notes: BP cuff, EKG monitors applied. Sedation begun. Artery and nerve location verified with U/S and anesthetic injected incrementally, slowly, and after negative aspirations under direct u/s guidance. Good fascial /perineural spread. Tolerated well.     

## 2021-03-19 NOTE — Discharge Instructions (Addendum)
Post-Surgery Instructions  1. If you are recuperating from surgery anywhere other than home, please be sure to leave [place] a number where you can be reached. 2. Go directly home and rest. 3. The keep operated foot (or feet) elevated six inches above the hip when sitting or lying down. 4. Support the elevated foot and leg with pillows under the calf. DO NOT PLACE PILLOWS UNDER THE KNEE. 5. DO NOT REMOVE or get your bandages wet. This will increase your chances of getting an infection. 6. Wear your surgical shoe at all times when you are up. 7. A limited amount of pain and swelling may occur. The skin may take on a bruised appearance. This is no cause for alarm. 8. For slight pain and swelling, apply an ice pack directly over the bandage for 15 minutes every hour. Continue icing until seen in the office. DO NOT apply any form of heat to the area. 9. Have prescription(s) filled immediately and take as directed. 10. Drink lots of liquids, water, and juice. 11. CALL THE OFFICE IMMEDIATELY IF: a. Bleeding continues b. Pain increases and/or does not respond to medication c. Bandage or cast appears too tight d. Any liquids (water, coffee, etc.) have spilled on your bandages. e. Tripping, falling, or stubbing the surgical foot f. If your temperature rises above 101 g. If you have ANY questions at all 12. Please use the crutches, knee scooter, or walker you have prescribed, rented, or purchased. If you are non-weight bearing DO NOT put weight on the operated foot for _________ days. If you are weight-bearing, follow your physician's instructions. You are expected to be: ? weight-bearing ? non-weight bearing 13. Special Instructions: _____________________________________________________________ _________________________________________________________________________________ _________________________________________________________________________________  14. Your next appointment is: 03/25/2021  9:45 AM  If you need to reach the nurse for any reason, please call: [place]: [phone] [place]: [phone] [place]: [phone]  Post Anesthesia Home Care Instructions  Activity: Get plenty of rest for the remainder of the day. A responsible adult should stay with you for 24 hours following the procedure.  For the next 24 hours, DO NOT: -Drive a car -Advertising copywriter -Drink alcoholic beverages -Take any medication unless instructed by your physician -Make any legal decisions or sign important papers.  Meals: Start with liquid foods such as gelatin or soup. Progress to regular foods as tolerated. Avoid greasy, spicy, heavy foods. If nausea and/or vomiting occur, drink only clear liquids until the nausea and/or vomiting subsides. Call your physician if vomiting continues.  Special Instructions/Symptoms: Your throat may feel dry or sore from the anesthesia or the breathing tube placed in your throat during surgery. If this causes discomfort, gargle with warm salt water. The discomfort should disappear within 24 hours.       Regional Anesthesia Blocks  1. Numbness or the inability to move the "blocked" extremity may last from 3-48 hours after placement. The length of time depends on the medication injected and your individual response to the medication. If the numbness is not going away after 48 hours, call your surgeon.  2. The extremity that is blocked will need to be protected until the numbness is gone and the  Strength has returned. Because you cannot feel it, you will need to take extra care to avoid injury. Because it may be weak, you may have difficulty moving it or using it. You may not know what position it is in without looking at it while the block is in effect.  3. For blocks in  the legs and feet, returning to weight bearing and walking needs to be done carefully. You will need to wait until the numbness is entirely gone and the strength has  returned. You should be able to move your leg and foot normally before you try and bear weight or walk. You will need someone to be with you when you first try to ensure you do not fall and possibly risk injury.  4. Bruising and tenderness at the needle site are common side effects and will resolve in a few days.  5. Persistent numbness or new problems with movement should be communicated to the surgeon or the [facility] [phone] [facility] [phone]).

## 2021-03-19 NOTE — Anesthesia Procedure Notes (Signed)
Anesthesia Regional Block: Popliteal block   Pre-Anesthetic Checklist: , timeout performed,  Correct Patient, Correct Site, Correct Laterality,  Correct Procedure, Correct Position, site marked,  Risks and benefits discussed,  Pre-op evaluation,  Post-op pain management  Laterality: Right  Prep: chloraprep       Needles:  Injection technique: Single-shot  Needle Type: Stimiplex     Needle Length: 10cm  Needle Gauge: 21     Additional Needles:   Procedures:,,,, ultrasound used (permanent image in chart),,   Motor weakness within 5 minutes.  Narrative:  Start time: 03/19/2021 7:15 AM End time: 03/19/2021 7:25 AM Injection made incrementally with aspirations every 5 mL.  Performed by: Personally  Anesthesiologist: Lewie Loron, MD  Additional Notes: Nerve located and needle positioned with direct ultrasound guidance. Good perineural spread. Patient tolerated well.

## 2021-03-19 NOTE — Brief Op Note (Signed)
03/19/2021  10:09 AM  PATIENT:  Lorette Ang  30 y.o. male  PRE-OPERATIVE DIAGNOSIS:  FLAT FEET RIGHT FOOT  POST-OPERATIVE DIAGNOSIS:  FLAT FEET RIGHT FOOT  PROCEDURE:  Procedure(s) with comments: FLAT FOOT RECONSTRUCTION WITH BONE CUTS IN HEEL AND MIDFOOT WITH CADAVER GRAFT, LENTHENING OF CALF MUSCLE (Right) - BLOCK AND FOOT  SURGEON:  Surgeon(s) and Role:    * Chelli Yerkes, Rachelle Hora, DPM - Primary    * Park Liter, DPM - Assisting  PHYSICIAN ASSISTANT:   ASSISTANTS: Ventura Sellers DPM   ANESTHESIA:   regional and general  EBL:  <16mL   BLOOD ADMINISTERED:none  DRAINS: none   LOCAL MEDICATIONS USED:  NONE  SPECIMEN:  No Specimen  DISPOSITION OF SPECIMEN:  N/A  COUNTS:  YES  TOURNIQUET:   Total Tourniquet Time Documented: Thigh (Right) - 99 minutes Total: Thigh (Right) - 99 minutes   DICTATION: .Note written in EPIC  PLAN OF CARE: Discharge to home after PACU  PATIENT DISPOSITION:  PACU - hemodynamically stable.   Delay start of Pharmacological VTE agent (>24hrs) due to surgical blood loss or risk of bleeding: not applicable

## 2021-03-19 NOTE — Anesthesia Procedure Notes (Signed)
Procedure Name: LMA Insertion Date/Time: 03/19/2021 7:54 AM Performed by: Caren Macadam, CRNA Pre-anesthesia Checklist: Patient identified, Emergency Drugs available, Suction available and Patient being monitored Patient Re-evaluated:Patient Re-evaluated prior to induction Oxygen Delivery Method: Circle System Utilized Preoxygenation: Pre-oxygenation with 100% oxygen Induction Type: IV induction Ventilation: Mask ventilation without difficulty LMA: LMA inserted LMA Size: 5.0 Number of attempts: 1 Placement Confirmation: positive ETCO2 and breath sounds checked- equal and bilateral Tube secured with: Tape Dental Injury: Teeth and Oropharynx as per pre-operative assessment

## 2021-03-19 NOTE — Interval H&P Note (Signed)
History and Physical Interval Note:  03/19/2021 7:16 AM  Chad Long  has presented today for surgery, with the diagnosis of FLAT FEET RIGHT FOOT.  The various methods of treatment have been discussed with the patient and family. After consideration of risks, benefits and other options for treatment, the patient has consented to  Procedure(s) with comments: FLAT FOOT RECONSTRUCTION-TAL GASTROC NEMIUS  RECESSION (Right) - BLOCK as a surgical intervention.  The patient's history has been reviewed, patient examined, no change in status, stable for surgery.  I have reviewed the patient's chart and labs.  Questions were answered to the patient's satisfaction.     Edwin Cap

## 2021-03-19 NOTE — Transfer of Care (Signed)
Immediate Anesthesia Transfer of Care Note  Patient: Chad Long  Procedure(s) Performed: FLAT FOOT RECONSTRUCTION WITH BONE CUTS IN HEEL AND MIDFOOT WITH CADAVER GRAFT, LENTHENING OF CALF MUSCLE (Right: Leg Lower)  Patient Location: PACU  Anesthesia Type:General  Level of Consciousness: drowsy  Airway & Oxygen Therapy: Patient Spontanous Breathing and Patient connected to face mask oxygen  Post-op Assessment: Report given to RN and Post -op Vital signs reviewed and stable  Post vital signs: Reviewed and stable  Last Vitals:  Vitals Value Taken Time  BP 95/47 03/19/21 1008  Temp    Pulse 89 03/19/21 1014  Resp 20 03/19/21 1014  SpO2 98 % 03/19/21 1014  Vitals shown include unvalidated device data.  Last Pain:  Vitals:   03/19/21 0730  PainSc: 0-No pain      Patients Stated Pain Goal: 5 (03/19/21 0730)  Complications: No notable events documented.

## 2021-03-20 DIAGNOSIS — M2141 Flat foot [pes planus] (acquired), right foot: Secondary | ICD-10-CM

## 2021-03-20 NOTE — Op Note (Signed)
Patient Name: Chad Long DOB: Sep 02, 1991  MRN: [id number]   Date of Service: 03/19/2021  Surgeon: Dr. Sharl Ma, DPM Assistants: Dr. Ventura Sellers, D.P.M. 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 Item Serial No. Manufacturer Lot No. LRB No. Used Action  ALLOGRAFT EVANS WEDGE [id number] - [id number] Orthopedic Graft ALLOGRAFT EVANS WEDGE [id number]  [employer] [id number] Right 1 Implanted  ALLOGRAFT COTTON WEDGE [id number] - [id number] Orthopedic Graft ALLOGRAFT COTTON WEDGE [id number]  [employer] [id number] Right 1 Implanted   Medications: None Complications: None  Indications for Procedure:  This is a 30 y.o. male with a history of painful pes planovalgus deformity, gastrocnemius equinus and sinus tarsi syndrome. After failing non surgical treatment he opted for surgical reconstruction.  Previously, he underwent the same procedure on his left lower extremity 4 months ago.  All risks, benefits, and potential complications were discussed prior to surgery. Informed consent was reviewed and signed in the office and was available on the day of surgery for review as well.   Procedure in Detail: The patient was identified in the preoperative holding area and the correct site and side of surgery was reviewed,  confirmed and the right lower extremity was marked with an indelible marker. He underwent regional block of the popliteal and saphenous nerves by the anesthesia team. Following this, he was taken to the operating room and transferred from the stretcher to the operating room table. General inhalational anesthesia was then induced. A timeout for safety per protocol was then performed. The right lower extremity was then prepped and draped in the usual sterile fashion. The right lower extremity was elevated, exsanguinated and the pneumatic thigh tourniquet was inflated to 325 mmHg.   I directed my attention to the posterior calf where a paramedian posterior incision was made overlying the gastrocnemius aponeurosis just distal to the medial gastrocnemius head. This was carried deep through subcutaneous tissues and small bleeding vessels that could not be safely retracted were clamped and tied or Bovied as necessary. The saphenous neurovascular bundle was readily identified and protected. Dissection was carried down to the level of the deep fascia and the paratenon of the gastrocnemius, and the aponeurosis was readily identified. The paratenon was incised and it was retracted medially and laterally ensuring that the sural nerve and saphenous bundle was safely protected throughout this part of the procedure. The gastrocnemius aponeurosis was incised transversely with gentle dorsiflexion placed on the foot at the ankle until adequately lengthened. This wound was then thoroughly irrigated with normal sterile saline and closure was initiated. The paratenon was repaired with 2-0 Vicryl, subcutaneous tissue was repaired with 2-0 Vicryl and 3-0 Monocryl and skin was repaired with 3-0 nylon.   We then turned our attention to the lateral foot overlying the calcaneus. Fluoroscopy was used to confirm placement of the incision where the Evans osteotomy would need to be made. A horizontal incision was then made and dissection was  carried deep through  subcutaneous tissues ensuring all vital neurovascular structures were retracted safely out of harms way. Small bleeding vessels were cauterized and clamped and tied as necessary. The sural nerve and the peroneal tendons were identified and safely retracted. The dissection was carried deep to the level of the lateral wall of the calcaneus and fluoroscopy was again used to confirm the position of the osteotomy just anterior to the posterior facet in the floor of the sinus tarsi. The osteotomy was created using a sagittal saw and completed with an osteotome through the medial cortex. A Hintermann retractor with a 2.0 mm Steinmann pin was then placed to distract the osteotomy. We then directed our attention to the dorsal midfoot overlying the medial cuneiform. An incision was made overlying this and carried deep through subcutaneous tissue. The neurovascular bundle was retracted laterally and the extensor hallucis longus tendon was retracted medially. The periosteum was incised and reflected. Position and placement of a Cotton osteotomy was confirmed with fluoroscopy. This was then created using a sagittal saw, the plantar cortex was left intact. Once this was complete, trial sizers for bone graft wedges were then placed in each of the osteotomy sites. It was determined that a size 8 mm Evans wedge graft and a size 6 mm Cotton wedge graft would be adequate. This gave adequate triplanar correction and recreation of the medial longitudinal arch with reduction in forefoot abduction. This correction was confirmed with fluoroscopy. The bone graft wedges were then rehydrated in normal sterile saline for 5 minutes. They were then placed into the osteotomy sites and tamped carefully into position ensuring that the dorsal cortex of the Cotton graft and lateral and dorsal cortex cortices of the Evan's graft were not prominent.   Final fluoroscopic films were then taken. Both wounds were then thoroughly  irrigated and layered closure was initiated with 3-0 Monocryl and the skin being closed with 3-0 nylon. The tourniquet was deflated after a total of 120 minutes with immediate return of peripheral pulses and capillary refill to all digits. Hemostasis was achieved following tourniquet deflation and prior to closure. Dressings were applied with xeroform, 4 x 4 gauze, Kerlix an ACE wrap and ABD pad and a modified Jones compression dressing with fiberglass shell with the foot positioned at a right angle of the leg. He will be nonweightbearing and will follow up with me in 1 week in the office.   Disposition: Following a period of post-operative monitoring, patient will be transferred to home.

## 2021-03-21 NOTE — Anesthesia Postprocedure Evaluation (Signed)
Anesthesia Post Note  Patient: Chad Long  Procedure(s) Performed: FLAT FOOT RECONSTRUCTION WITH BONE CUTS IN HEEL AND MIDFOOT WITH CADAVER GRAFT, LENTHENING OF CALF MUSCLE (Right: Leg Lower)     Patient location during evaluation: PACU Anesthesia Type: General Level of consciousness: sedated and patient cooperative Pain management: pain level controlled Vital Signs Assessment: post-procedure vital signs reviewed and stable Respiratory status: spontaneous breathing Cardiovascular status: stable Anesthetic complications: no   No notable events documented.  Last Vitals:  Vitals:   03/19/21 1100 03/19/21 1130  BP: 112/64 121/70  Pulse: 95 85  Resp: 12 18  Temp:  36.7 C  SpO2: 97% 99%    Last Pain:  Vitals:   03/19/21 1130  PainSc: 7                  [place] [employer]

## 2021-03-22 ENCOUNTER — Encounter (HOSPITAL_BASED_OUTPATIENT_CLINIC_OR_DEPARTMENT_OTHER): Payer: Self-pay | Admitting: Podiatry

## 2021-03-22 ENCOUNTER — Telehealth: Payer: Self-pay | Admitting: Podiatry

## 2021-03-23 NOTE — Telephone Encounter (Signed)
Can you refuse this?  Last fill was 03/19/21 too early.

## 2021-03-24 NOTE — Telephone Encounter (Signed)
Patient calling to request refill on pain medication. Says he's been taking the pain medication, ibuprofen, and acetomorphine all at the same time due to pain. Refill does not end until 6/17 so it may be too early to fill. Please advise.

## 2021-03-24 NOTE — Telephone Encounter (Signed)
I see him tomorrow and will re-fill if needed

## 2021-03-25 ENCOUNTER — Encounter: Payer: 59 | Admitting: Podiatry

## 2021-03-26 ENCOUNTER — Other Ambulatory Visit: Payer: Self-pay | Admitting: Podiatry

## 2021-03-26 ENCOUNTER — Encounter (HOSPITAL_BASED_OUTPATIENT_CLINIC_OR_DEPARTMENT_OTHER): Payer: Self-pay | Admitting: Podiatry

## 2021-03-26 MED ORDER — OXYCODONE HCL 10 MG PO TABS: 10.0000 mg | ORAL_TABLET | Freq: Four times a day (QID) | ORAL | 0 refills | Status: DC | PRN

## 2021-03-26 MED ORDER — 0.9 % SODIUM CHLORIDE (POUR BTL) OPTIME
TOPICAL | Status: DC | PRN
  Administered 2021-03-19: 1000 mL

## 2021-03-29 ENCOUNTER — Telehealth: Payer: Self-pay | Admitting: Podiatry

## 2021-03-29 ENCOUNTER — Ambulatory Visit (INDEPENDENT_AMBULATORY_CARE_PROVIDER_SITE_OTHER): Payer: 59

## 2021-03-29 ENCOUNTER — Ambulatory Visit (INDEPENDENT_AMBULATORY_CARE_PROVIDER_SITE_OTHER): Payer: 59 | Admitting: Podiatry

## 2021-03-29 ENCOUNTER — Other Ambulatory Visit: Payer: Self-pay

## 2021-03-29 DIAGNOSIS — Z9889 Other specified postprocedural states: Secondary | ICD-10-CM

## 2021-03-29 DIAGNOSIS — S99922A Unspecified injury of left foot, initial encounter: Secondary | ICD-10-CM

## 2021-03-29 DIAGNOSIS — M2141 Flat foot [pes planus] (acquired), right foot: Secondary | ICD-10-CM

## 2021-03-29 DIAGNOSIS — M216X1 Other acquired deformities of right foot: Secondary | ICD-10-CM | POA: Diagnosis not present

## 2021-03-29 DIAGNOSIS — M21861 Other specified acquired deformities of right lower leg: Secondary | ICD-10-CM

## 2021-03-29 DIAGNOSIS — M2142 Flat foot [pes planus] (acquired), left foot: Secondary | ICD-10-CM

## 2021-03-29 NOTE — Telephone Encounter (Signed)
[place] is calling to schedule for cast change, thanks!

## 2021-03-29 NOTE — Telephone Encounter (Signed)
Pt called to let you know he got his cast wet in the shower. I transferred him to the nurse just in case she could get to him first, but I'm also notifying you as well. Please advise.

## 2021-03-29 NOTE — Telephone Encounter (Signed)
He should come in for a cast change

## 2021-03-29 NOTE — Telephone Encounter (Signed)
Patient said his cast got really wet and needs to find out what he should do.

## 2021-03-29 NOTE — Telephone Encounter (Signed)
OK - thanks

## 2021-03-30 ENCOUNTER — Other Ambulatory Visit: Payer: Self-pay | Admitting: Podiatry

## 2021-03-30 ENCOUNTER — Encounter: Payer: Self-pay | Admitting: Podiatry

## 2021-03-30 ENCOUNTER — Telehealth: Payer: Self-pay | Admitting: Podiatry

## 2021-03-30 DIAGNOSIS — Z9889 Other specified postprocedural states: Secondary | ICD-10-CM

## 2021-03-30 MED ORDER — IBUPROFEN 600 MG PO TABS: 600.0000 mg | ORAL_TABLET | Freq: Three times a day (TID) | ORAL | 0 refills | Status: AC | PRN

## 2021-03-30 MED ORDER — OXYCODONE HCL 10 MG PO TABS: 10.0000 mg | ORAL_TABLET | Freq: Four times a day (QID) | ORAL | 0 refills | Status: DC | PRN

## 2021-03-30 NOTE — Progress Notes (Signed)
  Subjective:  Patient ID: Chad Long, male    DOB: 05-29-1991,  MRN: [id number]  Chief Complaint  Patient presents with   Foot Injury    Cast change pt fell     DOS: 03/19/2021 Procedure: Right foot gastrocnemius Strayer recession, Evans osteotomy, cotton osteotomy  30 y.o. male returns for post-op check.  Missed his last and first postop appointment.  Today he got his cast wet and asked for an urgent appointment.  On his way in he was at [facility] and slipped and fell and bent his crutch and fell on the cast.  The cast is broken.  Review of Systems: Negative except as noted in the HPI. Denies N/V/F/Ch.   Objective:  There were no vitals filed for this visit. There is no height or weight on file to calculate BMI. Constitutional Well developed. Well nourished.  Vascular Foot warm and well perfused. Capillary refill normal to all digits.   Neurologic Normal speech. Oriented to person, place, and time. Epicritic sensation to light touch grossly present bilaterally.  Dermatologic Skin healing well without signs of infection. Skin edges well coapted without signs of infection.  Cast broken at heel and has evidence of weightbearing  Orthopedic: Tenderness to palpation noted about the surgical site.   Multiple view plain film radiographs: Right foot osteotomies and grafts maintained with correction equivalent to preoperative radiographs, no acute osseous abnormalities due to his fall Assessment:   1. Injury of left foot, initial encounter   2. Gastrocnemius equinus of right lower extremity   3. Post-operative state   4. Pes planus of both feet    Plan:  Patient was evaluated and treated and all questions answered.  S/p foot surgery right -Fortunately he did not injure the extremity or compromise of the surgical correction.  I reviewed his x-rays with him.  Emphasize importance of being careful while using crutches and knee scooter and maintaining nonweightbearing -XR: As above -WB  Status: NWB in below-knee cast Was applied today -Sutures: We will remove at next visit on 04/08/2021.  Will not need new x-rays that day -Medications: Refilled ibuprofen and oxycodone, use and side effects reviewed -Foot redressed.  Well-padded below-knee fiberglass cast applied  No follow-ups on file.

## 2021-03-30 NOTE — Telephone Encounter (Signed)
Pt called stating he was supposed to have medication sent to his pharmacy and his pharmacy never received it. Please advise.

## 2021-03-30 NOTE — Telephone Encounter (Signed)
Just sent. Thank you.

## 2021-04-01 ENCOUNTER — Encounter: Payer: 59 | Admitting: Podiatry

## 2021-04-05 ENCOUNTER — Other Ambulatory Visit: Payer: Self-pay

## 2021-04-05 ENCOUNTER — Encounter: Payer: Self-pay | Admitting: Podiatry

## 2021-04-05 ENCOUNTER — Ambulatory Visit (INDEPENDENT_AMBULATORY_CARE_PROVIDER_SITE_OTHER): Payer: 59 | Admitting: Podiatry

## 2021-04-05 DIAGNOSIS — M62461 Contracture of muscle, right lower leg: Secondary | ICD-10-CM

## 2021-04-05 DIAGNOSIS — M21861 Other specified acquired deformities of right lower leg: Secondary | ICD-10-CM

## 2021-04-05 DIAGNOSIS — M2141 Flat foot [pes planus] (acquired), right foot: Secondary | ICD-10-CM

## 2021-04-05 DIAGNOSIS — M2142 Flat foot [pes planus] (acquired), left foot: Secondary | ICD-10-CM

## 2021-04-05 DIAGNOSIS — Z4789 Encounter for other orthopedic aftercare: Secondary | ICD-10-CM

## 2021-04-05 DIAGNOSIS — M216X1 Other acquired deformities of right foot: Secondary | ICD-10-CM

## 2021-04-05 MED ORDER — OXYCODONE HCL 10 MG PO TABS: 10.0000 mg | ORAL_TABLET | Freq: Three times a day (TID) | ORAL | 0 refills | Status: AC | PRN

## 2021-04-05 NOTE — Progress Notes (Signed)
  Subjective:  Patient ID: Chad Long, male    DOB: Jan 25, 1991,  MRN: [id number]  Chief Complaint  Patient presents with   Routine Post Op     POV #4 DOS 03/19/2021 RT FOOT FLAT FOOT RECONSTRUCTION W/BONE CUT IN HEEL AND MID FOOT W/CADAVER BONE GRAFT, CALF LENGTHENING - cast is wet, suture removal     DOS: 03/19/2021 Procedure: Right foot gastrocnemius Strayer recession, Evans osteotomy, cotton osteotomy  30 y.o. male returns for post-op check.  Cast is wet again secondary to sweat.  Review of Systems: Negative except as noted in the HPI. Denies N/V/F/Ch.   Objective:  There were no vitals filed for this visit. There is no height or weight on file to calculate BMI. Constitutional Well developed. Well nourished.  Vascular Foot warm and well perfused. Capillary refill normal to all digits.   Neurologic Normal speech. Oriented to person, place, and time. Epicritic sensation to light touch grossly present bilaterally.  Dermatologic Skin healing well without signs of infection. Skin edges well coapted without signs of infection.  Cast is wet and malodorous  Orthopedic: Tenderness to palpation noted about the surgical site.   Multiple view plain film radiographs: Right foot osteotomies and grafts maintained with correction equivalent to preoperative radiographs, no acute osseous abnormalities due to his fall Assessment:   1. Pes planus of both feet   2. Gastrocnemius equinus of right lower extremity   3. Problem with fiberglass cast     Plan:  Patient was evaluated and treated and all questions answered.  S/p foot surgery right --Sutures removed today -Did not reapply cast as these been problematic for him.  Advised to wear cam boot at all times and treat like a cast.  He may bathe the foot. -NWB on knee scooter and crutches -Refilled his oxycodone pain prescription.  This will likely be the last one for him  Return in 5 weeks (on 05/13/2021).

## 2021-04-08 ENCOUNTER — Encounter: Payer: 59 | Admitting: Podiatry

## 2021-04-15 ENCOUNTER — Encounter: Payer: 59 | Admitting: Podiatry

## 2021-04-19 ENCOUNTER — Ambulatory Visit: Payer: 59 | Admitting: Medical

## 2021-04-20 ENCOUNTER — Emergency Department (HOSPITAL_COMMUNITY)
Admission: EM | Admit: 2021-04-20 | Discharge: 2021-04-20 | Disposition: A | Discharge status: Home / Self Care | Payer: 59 | Attending: Emergency Medicine | Admitting: Emergency Medicine

## 2021-04-20 ENCOUNTER — Encounter (HOSPITAL_COMMUNITY): Payer: Self-pay | Admitting: Emergency Medicine

## 2021-04-20 DIAGNOSIS — Z87891 Personal history of nicotine dependence: Secondary | ICD-10-CM | POA: Insufficient documentation

## 2021-04-20 DIAGNOSIS — R1013 Epigastric pain: Secondary | ICD-10-CM | POA: Insufficient documentation

## 2021-04-20 DIAGNOSIS — Z8616 Personal history of COVID-19: Secondary | ICD-10-CM | POA: Insufficient documentation

## 2021-04-20 LAB — CBC
HCT: 44.1 % (ref 39.0–52.0)
Hemoglobin: 15 g/dL (ref 13.0–17.0)
MCH: 29.7 pg (ref 26.0–34.0)
MCHC: 34 g/dL (ref 30.0–36.0)
MCV: 87.3 fL (ref 80.0–100.0)
Platelets: 260 10*3/uL (ref 150–400)
RBC: 5.05 MIL/uL (ref 4.22–5.81)
RDW: 12.7 % (ref 11.5–15.5)
WBC: 12.7 10*3/uL — ABNORMAL HIGH (ref 4.0–10.5)
nRBC: 0 % (ref 0.0–0.2)

## 2021-04-20 LAB — URINALYSIS, ROUTINE W REFLEX MICROSCOPIC
Bilirubin Urine: NEGATIVE
Glucose, UA: NEGATIVE mg/dL
Hgb urine dipstick: NEGATIVE
Ketones, ur: NEGATIVE mg/dL
Leukocytes,Ua: NEGATIVE
Nitrite: NEGATIVE
Protein, ur: NEGATIVE mg/dL
Specific Gravity, Urine: 1.01 (ref 1.005–1.030)
pH: 6 (ref 5.0–8.0)

## 2021-04-20 LAB — COMPREHENSIVE METABOLIC PANEL
ALT: 24 U/L (ref 0–44)
AST: 22 U/L (ref 15–41)
Albumin: 4.4 g/dL (ref 3.5–5.0)
Alkaline Phosphatase: 83 U/L (ref 38–126)
Anion gap: 9 (ref 5–15)
BUN: 17 mg/dL (ref 6–20)
CO2: 24 mmol/L (ref 22–32)
Calcium: 9.1 mg/dL (ref 8.9–10.3)
Chloride: 102 mmol/L (ref 98–111)
Creatinine, Ser: 1.02 mg/dL (ref 0.61–1.24)
GFR, Estimated: >60 mL/min (ref >60)
Glucose, Bld: 97 mg/dL (ref 70–99)
Potassium: 3.9 mmol/L (ref 3.5–5.1)
Sodium: 135 mmol/L (ref 135–145)
Total Bilirubin: 1.1 mg/dL (ref 0.3–1.2)
Total Protein: 7.8 g/dL (ref 6.5–8.1)

## 2021-04-20 LAB — LIPASE, BLOOD: Lipase: 33 U/L (ref 11–51)

## 2021-04-20 MED ORDER — LIDOCAINE VISCOUS HCL 2 % MT SOLN
15.0000 mL | Freq: Once | OROMUCOSAL | Status: AC
  Administered 2021-04-20: 15 mL via ORAL
  Filled 2021-04-20: qty 15

## 2021-04-20 MED ORDER — ALUM & MAG HYDROXIDE-SIMETH 200-200-20 MG/5ML PO SUSP
30.0000 mL | Freq: Once | ORAL | Status: AC
  Administered 2021-04-20: 30 mL via ORAL
  Filled 2021-04-20: qty 30

## 2021-04-20 MED ORDER — SODIUM CHLORIDE 0.9 % IV BOLUS
1000.0000 mL | Freq: Once | INTRAVENOUS | Status: AC
  Administered 2021-04-20: 1000 mL via INTRAVENOUS

## 2021-04-20 NOTE — ED Notes (Signed)
Pt uses post surgical device to assist w ambulating, pt is steady w device, wife on way to pick pt up, pt waiting in WR

## 2021-04-20 NOTE — ED Provider Notes (Signed)
[facility]-EMERGENCY DEPT Provider Note   CSN: [id number] Arrival date & time: 04/20/21  1113     History Chief Complaint  Patient presents with   Abdominal Pain    Chad Long is a 30 y.o. male.  The history is provided by the patient.  Abdominal Pain Pain location:  Epigastric Pain quality: aching   Pain radiates to:  Does not radiate Pain severity:  Mild Onset quality:  Gradual Duration:  1 day Progression:  Improving Chronicity:  New Context: alcohol use   Relieved by:  Nothing Worsened by:  Nothing Associated symptoms: no anorexia, no belching, no chest pain, no chills, no constipation, no cough, no diarrhea, no dysuria, no fatigue, no fever, no hematuria, no melena, no nausea, no shortness of breath, no sore throat and no vomiting       Past Medical History:  Diagnosis Date   Allergic rhinitis    Anxiety    Depression    Flat foot    History of 2019 novel coronavirus disease (COVID-19) 09/17/2019   positive test result in epic, per pt had mild symptoms that resolved   OSA (obstructive sleep apnea)    followed by pulmonologist---Llindsey Tyron Russell NP---  pt study in epic 06-22-2020 mild osa and pt titrated for cpap,  on 03-17-2021 pt stated his cpap is on order and have not received yet    Patient Active Problem List   Diagnosis Date Noted   Acquired pes planus, right    Pes planus of left foot    Sinus tarsi syndrome, left    Gastrocnemius equinus, left     Past Surgical History:  Procedure Laterality Date   FINGER TENDON REPAIR Left 2010   index tendon repair from laceration   FLAT FOOT RECONSTRUCTION-TAL GASTROC RECESSION Left 11/20/2020   Procedure: FLAT FOOT RECONSTRUCTION-TAL GASTROC RECESSION;  Surgeon: Edwin Cap, DPM;  Location: [facility];  Service: Podiatry;  Laterality: Left;  WITH A BLOCK   FLAT FOOT RECONSTRUCTION-TAL GASTROC RECESSION Right 03/19/2021   Procedure: FLAT FOOT RECONSTRUCTION WITH BONE  CUTS IN HEEL AND MIDFOOT WITH CADAVER GRAFT, LENTHENING OF CALF MUSCLE;  Surgeon: Edwin Cap, DPM;  Location: [facility] ;  Service: Podiatry;  Laterality: Right;  BLOCK AND FOOT   PILONIDAL CYST EXCISION  09-19-2013  @ [facility]       Family History  Problem Relation Age of Onset   Diabetes Father    Diabetes Paternal Aunt    Diabetes Paternal Grandmother     Social History   Tobacco Use   Smoking status: Never   Smokeless tobacco: Former    Types: Chew    Quit date: 02/07/2015  Vaping Use   Vaping Use: Every day   Start date: 02/07/2015   Devices: Vuse  Substance Use Topics   Alcohol use: Yes    Comment: occasionAl    Home Medications Prior to Admission medications   Medication Sig Start Date End Date Taking? Authorizing Provider  gabapentin (NEURONTIN) 300 MG capsule Take 1 capsule (300 mg total) by mouth 3 (three) times daily for 7 days. 03/19/21 03/26/21  Edwin Cap, DPM    Allergies    Patient has no known allergies.  Review of Systems   Review of Systems  Constitutional:  Negative for chills, fatigue and fever.  HENT:  Negative for ear pain and sore throat.   Eyes:  Negative for pain and visual disturbance.  Respiratory:  Negative for cough and shortness  of breath.   Cardiovascular:  Negative for chest pain and palpitations.  Gastrointestinal:  Positive for abdominal pain. Negative for anorexia, constipation, diarrhea, melena, nausea and vomiting.  Genitourinary:  Negative for dysuria and hematuria.  Musculoskeletal:  Negative for arthralgias and back pain.  Skin:  Negative for color change and rash.  Neurological:  Negative for seizures and syncope.  All other systems reviewed and are negative.  Physical Exam Updated Vital Signs BP 137/80   Pulse (!) 101   Temp 99.6 F (37.6 C) (Oral)   Resp 18   SpO2 97%   Physical Exam Vitals and nursing note reviewed.  Constitutional:      General: He is not in acute distress.     Appearance: He is well-developed. He is not ill-appearing.  HENT:     Head: Normocephalic and atraumatic.  Eyes:     Extraocular Movements: Extraocular movements intact.     Conjunctiva/sclera: Conjunctivae normal.  Cardiovascular:     Rate and Rhythm: Normal rate and regular rhythm.     Heart sounds: Normal heart sounds. No murmur heard. Pulmonary:     Effort: Pulmonary effort is normal. No respiratory distress.     Breath sounds: Normal breath sounds.  Abdominal:     Palpations: Abdomen is soft.     Tenderness: There is no abdominal tenderness. There is no guarding or rebound. Negative signs include Murphy's sign and Rovsing's sign.  Musculoskeletal:     Cervical back: Neck supple.  Skin:    General: Skin is warm and dry.     Capillary Refill: Capillary refill takes less than 2 seconds.  Neurological:     General: No focal deficit present.     Mental Status: He is alert.  Psychiatric:        Mood and Affect: Mood normal.    ED Results / Procedures / Treatments   Labs (all labs ordered are listed, but only abnormal results are displayed) Labs Reviewed  CBC - Abnormal; Notable for the following components:      Result Value   WBC 12.7 (*)    All other components within normal limits  LIPASE, BLOOD  COMPREHENSIVE METABOLIC PANEL  URINALYSIS, ROUTINE W REFLEX MICROSCOPIC    EKG None  Radiology No results found.  Procedures Procedures   Medications Ordered in ED Medications  sodium chloride 0.9 % bolus 1,000 mL (1,000 mLs Intravenous New Bag/Given 04/20/21 1442)  alum & mag hydroxide-simeth (MAALOX/MYLANTA) 200-200-20 MG/5ML suspension 30 mL (30 mLs Oral Given 04/20/21 1440)    And  lidocaine (XYLOCAINE) 2 % viscous mouth solution 15 mL (15 mLs Oral Given 04/20/21 1440)    ED Course  I have reviewed the triage vital signs and the nursing notes.  Pertinent labs & imaging results that were available during my care of the patient were reviewed by me and considered  in my medical decision making (see chart for details).    MDM Rules/Calculators/A&P                          [name] is here with concern for dehydration.  Has had some upper abdominal pain.  Has been drinking alcohol.  Overall normal vitals.  No fever.  Not having any current abdominal pain.  No discomfort on exam.  Has not had any black stools or bloody stools.  Overall suspect gastritis.  Gallbladder and liver enzymes within normal limits.  Doubt cholecystitis or pancreatitis.  No urine infection.  Exam not consistent with appendicitis.  Felt better after IV fluids and GI cocktail.  Discharged in good condition.  Understands return precautions.  This chart was dictated using voice recognition software.  Despite best efforts to proofread,  errors can occur which can change the documentation meaning.   Final Clinical Impression(s) / ED Diagnoses Final diagnoses:  Epigastric pain    Rx / DC Orders ED Discharge Orders     None        Virgina Norfolk, DO 04/20/21 1528

## 2021-04-20 NOTE — Discharge Instructions (Addendum)
Continue oral hydration at home.  No concern for any infectious process at this time.  However if develop worsening pain, nausea, vomiting please come back for evaluation.

## 2021-04-20 NOTE — ED Triage Notes (Signed)
Per EMS, states he has been home due to a surgery-increased drinking cause of boredom-also used cocaine-states he feels like he is dehydrated

## 2021-04-21 ENCOUNTER — Encounter: Payer: Self-pay | Admitting: Emergency Medicine

## 2021-04-21 ENCOUNTER — Ambulatory Visit
Admission: EM | Admit: 2021-04-21 | Discharge: 2021-04-21 | Disposition: A | Discharge status: Home / Self Care | Payer: 59 | Attending: Emergency Medicine | Admitting: Emergency Medicine

## 2021-04-21 ENCOUNTER — Other Ambulatory Visit: Payer: Self-pay

## 2021-04-21 DIAGNOSIS — R519 Headache, unspecified: Secondary | ICD-10-CM

## 2021-04-21 DIAGNOSIS — R6883 Chills (without fever): Secondary | ICD-10-CM | POA: Diagnosis not present

## 2021-04-21 DIAGNOSIS — Z1152 Encounter for screening for COVID-19: Secondary | ICD-10-CM | POA: Diagnosis not present

## 2021-04-21 MED ORDER — NAPROXEN 500 MG PO TABS: 500.0000 mg | ORAL_TABLET | Freq: Two times a day (BID) | ORAL | 0 refills | Status: DC

## 2021-04-21 MED ORDER — KETOROLAC TROMETHAMINE 30 MG/ML IJ SOLN
30.0000 mg | Freq: Once | INTRAMUSCULAR | Status: AC
  Administered 2021-04-21: 30 mg via INTRAMUSCULAR

## 2021-04-21 NOTE — ED Triage Notes (Addendum)
Patient presents to [facility] for evaluation after being seen in the ER yesterday.  States he woke up today with headache, fatigue, and chills, which are not improving.  C/o decreased appetite with nausea.

## 2021-04-21 NOTE — Discharge Instructions (Addendum)
COVID test pending, monitor MyChart for results We gave you a shot of Toradol for your headache, continue with Naprosyn twice daily with food at home Rest and fluids Please follow-up if symptoms not improving or worsening

## 2021-04-21 NOTE — ED Provider Notes (Signed)
[facility]    CSN: [id number] Arrival date & time: 04/21/21  1231      History   Chief Complaint Chief Complaint  Patient presents with   Headache    HPI Allen Egerton is a 30 y.o. male presenting today for evaluation of headache.  Reports woke up today with headache fatigue and chills.  Decreased appetite and nausea.  Reports that he went to the emergency room yesterday morning due to dehydration from over drinking.  Was given fluids.  The symptoms have improved, but today with severe headache that has been persistent all day.  Reports history of migraines from prior [employer]	[place].  He reports similar headaches in the past.  Has not taken anything for his headache.  Denies vision changes, difficulty speaking, light sensitivity or nausea or vomiting.  He feels very chilled, but no known fevers.  HPI  Past Medical History:  Diagnosis Date   Allergic rhinitis    Anxiety    Depression    Flat foot    History of 2019 novel coronavirus disease (COVID-19) 09/17/2019   positive test result in epic, per pt had mild symptoms that resolved   OSA (obstructive sleep apnea)    followed by pulmonologist---Llindsey Tyron Russell NP---  pt study in epic 06-22-2020 mild osa and pt titrated for cpap,  on 03-17-2021 pt stated his cpap is on order and have not received yet    Patient Active Problem List   Diagnosis Date Noted   Acquired pes planus, right    Pes planus of left foot    Sinus tarsi syndrome, left    Gastrocnemius equinus, left     Past Surgical History:  Procedure Laterality Date   FINGER TENDON REPAIR Left 2010   index tendon repair from laceration   FLAT FOOT RECONSTRUCTION-TAL GASTROC RECESSION Left 11/20/2020   Procedure: FLAT FOOT RECONSTRUCTION-TAL GASTROC RECESSION;  Surgeon: Edwin Cap, DPM;  Location: [facility];  Service: Podiatry;  Laterality: Left;  WITH A BLOCK   FLAT FOOT RECONSTRUCTION-TAL GASTROC RECESSION Right 03/19/2021   Procedure:  FLAT FOOT RECONSTRUCTION WITH BONE CUTS IN HEEL AND MIDFOOT WITH CADAVER GRAFT, LENTHENING OF CALF MUSCLE;  Surgeon: Edwin Cap, DPM;  Location: [facility];  Service: Podiatry;  Laterality: Right;  BLOCK AND FOOT   PILONIDAL CYST EXCISION  09-19-2013  @ [facility]       Home Medications    Prior to Admission medications   Medication Sig Start Date End Date Taking? Authorizing Provider  naproxen (NAPROSYN) 500 MG tablet Take 1 tablet (500 mg total) by mouth 2 (two) times daily. 04/21/21  Yes Jenia Klepper, Junius Creamer, PA-C    Family History Family History  Problem Relation Age of Onset   Diabetes Father    Diabetes Paternal Aunt    Diabetes Paternal Grandmother     Social History Social History   Tobacco Use   Smoking status: Never   Smokeless tobacco: Former    Types: Chew    Quit date: 02/07/2015  Vaping Use   Vaping Use: Every day   Start date: 02/07/2015   Devices: Vuse  Substance Use Topics   Alcohol use: Yes    Comment: occasionAl     Allergies   Patient has no known allergies.   Review of Systems Review of Systems  Constitutional:  Positive for chills and fatigue. Negative for fever.  HENT:  Negative for congestion, sinus pressure and sore throat.   Eyes:  Negative for  photophobia, pain and visual disturbance.  Respiratory:  Negative for cough and shortness of breath.   Cardiovascular:  Negative for chest pain.  Gastrointestinal:  Negative for abdominal pain, nausea and vomiting.  Genitourinary:  Negative for decreased urine volume and hematuria.  Musculoskeletal:  Positive for myalgias. Negative for neck pain and neck stiffness.  Neurological:  Positive for headaches. Negative for dizziness, syncope, facial asymmetry, speech difficulty, weakness, light-headedness and numbness.    Physical Exam Triage Vital Signs ED Triage Vitals  Enc Vitals Group     BP 04/21/21 1241 126/82     Pulse Rate 04/21/21 1241 93     Resp 04/21/21 1241 18     Temp  04/21/21 1241 98.2 F (36.8 C)     Temp Source 04/21/21 1241 Oral     SpO2 04/21/21 1241 97 %     Weight --      Height --      Head Circumference --      Peak Flow --      Pain Score 04/21/21 1240 8     Pain Loc --      Pain Edu? --      Excl. in GC? --    No data found.  Updated Vital Signs BP 126/82 (BP Location: Right Arm)   Pulse 93   Temp 98.2 F (36.8 C) (Oral)   Resp 18   SpO2 97%   Visual Acuity Right Eye Distance:   Left Eye Distance:   Bilateral Distance:    Right Eye Near:   Left Eye Near:    Bilateral Near:     Physical Exam Vitals and nursing note reviewed.  Constitutional:      Appearance: He is well-developed.     Comments: No acute distress  HENT:     Head: Normocephalic and atraumatic.     Ears:     Comments: Bilateral ears without tenderness to palpation of external auricle, tragus and mastoid, EAC's without erythema or swelling, TM's with good bony landmarks and cone of light. Non erythematous.      Nose: Nose normal.     Mouth/Throat:     Comments: Oral mucosa pink and moist, no tonsillar enlargement or exudate. Posterior pharynx patent and nonerythematous, no uvula deviation or swelling. Normal phonation.  Eyes:     Extraocular Movements: Extraocular movements intact.     Conjunctiva/sclera: Conjunctivae normal.     Pupils: Pupils are equal, round, and reactive to light.  Cardiovascular:     Rate and Rhythm: Normal rate.  Pulmonary:     Effort: Pulmonary effort is normal. No respiratory distress.     Comments: Breathing comfortably at rest, CTABL, no wheezing, rales or other adventitious sounds auscultated  Abdominal:     General: There is no distension.  Musculoskeletal:        General: Normal range of motion.     Cervical back: Neck supple.     Comments: Wearing boot to left ankle  Skin:    General: Skin is warm and dry.  Neurological:     General: No focal deficit present.     Mental Status: He is alert and oriented to  person, place, and time. Mental status is at baseline.     Cranial Nerves: No cranial nerve deficit.     Motor: No weakness.     Gait: Gait normal.     UC Treatments / Results  Labs (all labs ordered are listed, but only abnormal results are displayed) Labs  Reviewed  NOVEL CORONAVIRUS, NAA    EKG   Radiology No results found.  Procedures Procedures (including critical care time)  Medications Ordered in UC Medications  ketorolac (TORADOL) 30 MG/ML injection 30 mg (30 mg Intramuscular Given 04/21/21 1419)    Initial Impression / Assessment and Plan / UC Course  I have reviewed the triage vital signs and the nursing notes.  Pertinent labs & imaging results that were available during my care of the patient were reviewed by me and considered in my medical decision making (see chart for details).     1 day of headache, body aches and chills, COVID test screening, suspect likely viral etiology.  No neurodeficits for headache and feel similar to prior migraines in the past.  Will perform Toradol prior to discharge and continue with Naprosyn at home.  Rest and fluids.  Continue to monitor,Discussed strict return precautions. Patient verbalized understanding and is agreeable with plan.  Final Clinical Impressions(s) / UC Diagnoses   Final diagnoses:  Encounter for screening for COVID-19  Acute nonintractable headache, unspecified headache type  Chills     Discharge Instructions      COVID test pending, monitor MyChart for results We gave you a shot of Toradol for your headache, continue with Naprosyn twice daily with food at home Rest and fluids Please follow-up if symptoms not improving or worsening     ED Prescriptions     Medication Sig Dispense Auth. Provider   naproxen (NAPROSYN) 500 MG tablet Take 1 tablet (500 mg total) by mouth 2 (two) times daily. 30 tablet Shawnae Leiva, Kake C, PA-C      PDMP not reviewed this encounter.   Lew Dawes,  PA-C 04/21/21 1423

## 2021-04-22 LAB — SARS-COV-2, NAA 2 DAY TAT

## 2021-04-22 LAB — NOVEL CORONAVIRUS, NAA: SARS-CoV-2, NAA: DETECTED — AB

## 2021-04-23 ENCOUNTER — Telehealth (HOSPITAL_COMMUNITY): Payer: Self-pay | Admitting: Emergency Medicine

## 2021-04-23 NOTE — Telephone Encounter (Signed)
Reviewed positive COVID test and answered all questions with patient

## 2021-05-04 ENCOUNTER — Ambulatory Visit (INDEPENDENT_AMBULATORY_CARE_PROVIDER_SITE_OTHER): Payer: 59 | Admitting: Medical

## 2021-05-04 ENCOUNTER — Encounter: Payer: Self-pay | Admitting: Medical

## 2021-05-04 ENCOUNTER — Other Ambulatory Visit: Payer: Self-pay

## 2021-05-04 VITALS — BP 134/80 | HR 82 | Resp 18 | Ht 71.0 in | Wt 230.0 lb

## 2021-05-04 DIAGNOSIS — F419 Anxiety disorder, unspecified: Secondary | ICD-10-CM

## 2021-05-04 DIAGNOSIS — F17200 Nicotine dependence, unspecified, uncomplicated: Secondary | ICD-10-CM

## 2021-05-04 DIAGNOSIS — F32A Depression, unspecified: Secondary | ICD-10-CM | POA: Diagnosis not present

## 2021-05-04 MED ORDER — SERTRALINE HCL 25 MG PO TABS: 25.0000 mg | ORAL_TABLET | Freq: Every day | ORAL | 0 refills | Status: DC

## 2021-05-04 MED ORDER — NICOTINE 21 MG/24HR TD PT24: 21.0000 mg | MEDICATED_PATCH | Freq: Every day | TRANSDERMAL | 0 refills | Status: DC

## 2021-05-04 NOTE — Patient Instructions (Addendum)
For anxiety and depression will prescribe sertraline 25 mg daily. Gave counselor information sheet. Let me know which office you have chosen. Give me name and number. Then will place referral. You had thoughts of harm when you came off pain meds. If those thoughts return then advise ED evaluation at [facility] long.Let me know as well.    Be cautious taking pain med in future avoid percocet.  Desire to stop vaping. Will rx nicotine patches. Stop vaping and use patch. Decrease dose of patch next month.  Follow up one month or as needed.

## 2021-05-04 NOTE — Progress Notes (Signed)
Subjective:    Patient ID: Chad Long, male    DOB: May 03, 1991, 30 y.o.   MRN: [id number]  HPI  Pt states he is feeling some anxiety and depression.  Pt states after his second surgery he was given percocet for pain.  As he stopped med he felt sad, hot flashes and sweat a lot. Pt thinks he had some withdrawal type symptoms. Pt stopped med since he was no longer in pain. Stopped abruptly. He was on med for about 2-3 weeks.  Pt states when he was in [place] he did anxiety. He states has been struggling with some anxiety.   Pt has been on medication for both anxiety and depression in the past. Last used in 2018. Pt remember trying sertraline and wellbutrin.  Does vape and eventually wants to stop.    Review of Systems  Constitutional:  Negative for chills, fatigue and fever.  Respiratory:  Negative for cough, chest tightness and wheezing.   Cardiovascular:  Negative for chest pain and palpitations.  Gastrointestinal:  Negative for abdominal pain, blood in stool and nausea.  Musculoskeletal:  Negative for back pain.  Psychiatric/Behavioral:  Positive for dysphoric mood. Negative for agitation, confusion, sleep disturbance and suicidal ideas. The patient is nervous/anxious.        No acitive thoughts of harm to self. But he states when he had withdrawal type symptoms had those thoughts. 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 Drinks 4-5 beers 2-3 times a week. Not working and therefore states drinking.     Past Medical History:  Diagnosis Date   Allergic rhinitis    Anxiety    Depression    Flat foot    History of 2019 novel coronavirus disease (COVID-19) 09/17/2019   positive test result in epic, per pt had mild symptoms that resolved   OSA (obstructive sleep apnea)    followed by pulmonologist---Llindsey Tyron Russell NP---  pt study in epic 06-22-2020 mild osa and pt titrated for cpap,  on 03-17-2021 pt stated his cpap is on order and have not received yet      Social History   Socioeconomic History   Marital status: Married    Spouse name: Not on file   Number of children: Not on file   Years of education: Not on file   Highest education level: Not on file  Occupational History   Occupation: Education administrator.  Tobacco Use   Smoking status: Never   Smokeless tobacco: Former    Types: Chew    Quit date: 02/07/2015  Vaping Use   Vaping Use: Every day   Start date: 02/07/2015   Devices: Vuse  Substance and Sexual Activity   Alcohol use: Yes    Comment: occasionAl   Drug use: Not on file    Comment: 03-17-2021  per pt last used teen   Sexual activity: Yes  Other Topics Concern   Not on file  Social History Narrative   Not on file   Social Determinants of Health   Financial Resource Strain: Not on file  Food Insecurity: Not on file  Transportation Needs: Not on file  Physical Activity: Not on file  Stress: Not on file  Social Connections: Not on file  Intimate Partner Violence: Not on file    Past Surgical History:  Procedure Laterality Date   FINGER TENDON REPAIR Left 2010   index tendon repair from laceration   FLAT FOOT RECONSTRUCTION-TAL GASTROC RECESSION Left 11/20/2020   Procedure: FLAT FOOT RECONSTRUCTION-TAL  GASTROC RECESSION;  Surgeon: Edwin Cap, DPM;  Location: [facility];  Service: Podiatry;  Laterality: Left;  WITH A BLOCK   FLAT FOOT RECONSTRUCTION-TAL GASTROC RECESSION Right 03/19/2021   Procedure: FLAT FOOT RECONSTRUCTION WITH BONE CUTS IN HEEL AND MIDFOOT WITH CADAVER GRAFT, LENTHENING OF CALF MUSCLE;  Surgeon: Edwin Cap, DPM;  Location: [facility];  Service: Podiatry;  Laterality: Right;  BLOCK AND FOOT   PILONIDAL CYST EXCISION  09-19-2013  @ [facility]    Family History  Problem Relation Age of Onset   Diabetes Father    Diabetes Paternal Aunt    Diabetes Paternal Grandmother     No Known Allergies  Current Outpatient Medications on File Prior to Visit  Medication Sig  Dispense Refill   naproxen (NAPROSYN) 500 MG tablet Take 1 tablet (500 mg total) by mouth 2 (two) times daily. 30 tablet 0   No current facility-administered medications on file prior to visit.    BP 134/80   Pulse 82   Resp 18   Ht 5\' 11"  (1.803 m)   Wt 230 lb (104.3 kg)   SpO2 98%   BMI 32.08 kg/m        Objective:   Physical Exam  General Mental Status- Alert. General Appearance- Not in acute distress.   Skin General: Color- Normal Color. Moisture- Normal Moisture.  Neck Carotid Arteries- Normal color. Moisture- Normal Moisture. No carotid bruits. No JVD.  Chest and Lung Exam Auscultation: Breath Sounds:-Normal.  Cardiovascular Auscultation:Rythm- Regular. Murmurs & Other Heart Sounds:Auscultation of the heart reveals- No Murmurs.  Abdomen Inspection:-Inspeection Normal. Palpation/Percussion:Note:No mass. Palpation and Percussion of the abdomen reveal- Non Tender, Non Distended + BS, no rebound or guarding.   Neurologic Cranial Nerve exam:- CN III-XII intact(No nystagmus), symmetric smile. Strength:- 5/5 equal and symmetric strength both upper and lower extremities.       Assessment & Plan:  For anxiety and depression will prescribe sertraline 25 mg daily. Gave counselor information sheet. Let me know which office you have chosen. Give me name and number. Then will place referral. You had thoughts of harm when you came off pain meds. If those thoughts return then advise ED evaluation at [facility] long.   Be cautious taking pain med in future avoid percocet.  Desire to stop vaping. Will rx nicotine patches. Stop vaping and use patch. Decrease dose of patch next month.  Follow up one month or as needed.  [facility], PA-C

## 2021-05-13 ENCOUNTER — Ambulatory Visit (INDEPENDENT_AMBULATORY_CARE_PROVIDER_SITE_OTHER): Payer: 59 | Admitting: Podiatry

## 2021-05-13 ENCOUNTER — Ambulatory Visit (INDEPENDENT_AMBULATORY_CARE_PROVIDER_SITE_OTHER): Payer: 59

## 2021-05-13 ENCOUNTER — Other Ambulatory Visit: Payer: Self-pay

## 2021-05-13 DIAGNOSIS — M21861 Other specified acquired deformities of right lower leg: Secondary | ICD-10-CM

## 2021-05-13 DIAGNOSIS — M216X1 Other acquired deformities of right foot: Secondary | ICD-10-CM

## 2021-05-13 DIAGNOSIS — B351 Tinea unguium: Secondary | ICD-10-CM | POA: Diagnosis not present

## 2021-05-13 DIAGNOSIS — M2141 Flat foot [pes planus] (acquired), right foot: Secondary | ICD-10-CM

## 2021-05-13 MED ORDER — IBUPROFEN 800 MG PO TABS: 800.0000 mg | ORAL_TABLET | Freq: Three times a day (TID) | ORAL | 0 refills | Status: DC | PRN

## 2021-05-13 MED ORDER — TERBINAFINE HCL 250 MG PO TABS: 250.0000 mg | ORAL_TABLET | Freq: Every day | ORAL | 0 refills | Status: AC

## 2021-05-17 ENCOUNTER — Telehealth: Payer: Self-pay | Admitting: Podiatry

## 2021-05-17 NOTE — Progress Notes (Signed)
  Subjective:  Patient ID: Chad Long, male    DOB: 1991/02/13,  MRN: [id number]  Chief Complaint  Patient presents with   Routine Post Op    Right foot surgery 03/19/21    DOS: 03/19/2021 Procedure: Right foot gastrocnemius Strayer recession, Evans osteotomy, cotton osteotomy  30 y.o. male returns for post-op check.  Doing well his pain is improving  Review of Systems: Negative except as noted in the HPI. Denies N/V/F/Ch.   Objective:  There were no vitals filed for this visit. There is no height or weight on file to calculate BMI. Constitutional Well developed. Well nourished.  Vascular Foot warm and well perfused. Capillary refill normal to all digits.   Neurologic Normal speech. Oriented to person, place, and time. Epicritic sensation to light touch grossly present bilaterally.  Dermatologic Skin healing well without signs of infection. Skin edges well coapted without signs of infection.  Cast is wet and malodorous.  He has onychomycosis of the toenails  Orthopedic: Tenderness to palpation noted about the surgical site.   New multiple view plain film radiographs: Right foot osteotomies and grafts maintained with correction there is early bridging across the osteotomy sites Assessment:   1. Gastrocnemius equinus of right lower extremity   2. Pes planus of both feet   3. Onychomycosis     Plan:  Patient was evaluated and treated and all questions answered.  S/p foot surgery right -Doing well he may begin partial transitioning to full weightbearing in the CAM boot.  He will return to see me in 4 weeks for new x-rays hopeful to transition to shoe gear at that time. -Now he is no longer drinks much Tylenol I prescribed him Lamisil for treatment of onychomycosis.  90-day course sent to pharmacy. -Ibuprofen for pain control  Return in about 4 weeks (around 06/10/2021) for post op (new x-rays).

## 2021-05-17 NOTE — Telephone Encounter (Signed)
Patient is requesting a callback in regards to pain in surgical foot.

## 2021-05-31 ENCOUNTER — Ambulatory Visit: Payer: 59 | Admitting: Medical

## 2021-05-31 ENCOUNTER — Other Ambulatory Visit: Payer: Self-pay | Admitting: Medical

## 2021-06-10 ENCOUNTER — Ambulatory Visit: Payer: 59 | Admitting: Podiatry

## 2021-06-15 ENCOUNTER — Telehealth: Payer: Self-pay | Admitting: Podiatry

## 2021-06-15 NOTE — Telephone Encounter (Signed)
Chad Long called and would like for you to extend him out of work until the end of the year. He said that he had surgery on both feet this year and does not think that he is able to return to work until next year. He does not have Disability with [facility], so he wants to talk to you about [facility] with [facility]. He would like for you to give him a call.

## 2021-06-17 ENCOUNTER — Telehealth: Payer: Self-pay | Admitting: Podiatry

## 2021-06-17 NOTE — Telephone Encounter (Signed)
I told patient what you said in note, about the [place] initiating. Mr. Chad Long still wants to speak with you, he said that you needed to tell the [facility] when he is able to return to work.

## 2021-06-29 ENCOUNTER — Ambulatory Visit: Payer: 59 | Admitting: Medical

## 2021-07-01 ENCOUNTER — Ambulatory Visit: Payer: 59 | Admitting: Podiatry

## 2021-07-22 ENCOUNTER — Ambulatory Visit (INDEPENDENT_AMBULATORY_CARE_PROVIDER_SITE_OTHER): Payer: 59 | Admitting: Podiatry

## 2021-07-22 ENCOUNTER — Ambulatory Visit (INDEPENDENT_AMBULATORY_CARE_PROVIDER_SITE_OTHER): Payer: 59

## 2021-07-22 ENCOUNTER — Encounter: Payer: Self-pay | Admitting: Podiatry

## 2021-07-22 ENCOUNTER — Other Ambulatory Visit: Payer: Self-pay

## 2021-07-22 DIAGNOSIS — M2141 Flat foot [pes planus] (acquired), right foot: Secondary | ICD-10-CM

## 2021-07-22 DIAGNOSIS — M2142 Flat foot [pes planus] (acquired), left foot: Secondary | ICD-10-CM | POA: Diagnosis not present

## 2021-07-22 DIAGNOSIS — M21861 Other specified acquired deformities of right lower leg: Secondary | ICD-10-CM

## 2021-07-22 DIAGNOSIS — M21862 Other specified acquired deformities of left lower leg: Secondary | ICD-10-CM

## 2021-07-22 MED ORDER — IBUPROFEN 800 MG PO TABS: 800.0000 mg | ORAL_TABLET | Freq: Three times a day (TID) | ORAL | 0 refills | Status: DC | PRN

## 2021-07-27 ENCOUNTER — Encounter: Payer: Self-pay | Admitting: Podiatry

## 2021-07-27 NOTE — Progress Notes (Signed)
  Subjective:  Patient ID: Chad Long, male    DOB: [birth year],  MRN: [id number]  Chief Complaint  Patient presents with   Flat Foot    right foot follow up-interested in orthotics    DOS: 03/19/2021 Procedure: Right foot gastrocnemius Strayer recession, Evans osteotomy, cotton osteotomy  30 y.o. male returns for post-op check.  Overall doing much better  Review of Systems: Negative except as noted in the HPI. Denies N/V/F/Ch.   Objective:  There were no vitals filed for this visit. There is no height or weight on file to calculate BMI. Constitutional Well developed. Well nourished.  Vascular Foot warm and well perfused. Capillary refill normal to all digits.   Neurologic Normal speech. Oriented to person, place, and time. Epicritic sensation to light touch grossly present bilaterally.  Dermatologic Well-healed surgical scars  Orthopedic: Minimal tenderness on the right foot evidence   New multiple view plain film radiographs: Of both feet, grafts are well integrated and healed with good correction Assessment:   1. Pes planus of both feet     Plan:  Patient was evaluated and treated and all questions answered.  S/p foot surgery right -Overall doing very well.  I recommended custom molded orthoses as well to support his correction long-term.  He was casted for these today -May return to work the first week of December -Ibuprofen as needed at this point  Return in about 3 months (around 10/22/2021).

## 2021-08-17 ENCOUNTER — Ambulatory Visit (INDEPENDENT_AMBULATORY_CARE_PROVIDER_SITE_OTHER): Payer: 59 | Admitting: Podiatry

## 2021-08-17 ENCOUNTER — Other Ambulatory Visit: Payer: Self-pay

## 2021-08-17 DIAGNOSIS — M2141 Flat foot [pes planus] (acquired), right foot: Secondary | ICD-10-CM | POA: Diagnosis not present

## 2021-08-17 DIAGNOSIS — L03031 Cellulitis of right toe: Secondary | ICD-10-CM

## 2021-08-17 DIAGNOSIS — L600 Ingrowing nail: Secondary | ICD-10-CM | POA: Diagnosis not present

## 2021-08-17 DIAGNOSIS — M2142 Flat foot [pes planus] (acquired), left foot: Secondary | ICD-10-CM

## 2021-08-17 NOTE — Patient Instructions (Signed)

## 2021-08-18 NOTE — Progress Notes (Signed)
  Subjective:  Patient ID: Chad Long, male    DOB: November 10, 1990,  MRN: [id number]  Chief Complaint  Patient presents with   Ingrown Toenail     R great ingrown    30 y.o. male presents with the above complaint. History confirmed with patient.  He is here today for ingrown toenail of the right foot which is a new issue.  Also has his orthotics ready today.  Overall doing well  Objective:  Physical Exam: warm, good capillary refill, no trophic changes or ulcerative lesions, normal DP and PT pulses, normal sensory exam, and well-healed surgical incisions,   Right Foot: Ingrown toenail medial border with paronychia   Assessment:   1. Ingrowing right great toenail   2. Pes planus of both feet   3. Paronychia of great toe of right foot      Plan:  Patient was evaluated and treated and all questions answered.  His orthotics are ready for dispensation today I examined them for form fit and function.  He was pleased with how he felt.  Reviewed the break-in period and possible need for adjustments.  He will return to see me as needed for this    Ingrown Nail, right -Patient elects to proceed with minor surgery to remove ingrown toenail today. Consent reviewed and signed by patient. -Ingrown nail excised. See procedure note. -Educated on post-procedure care including soaking. Written instructions provided and reviewed.   Procedure: Excision of Ingrown Toenail Location: Right 1st toe medial nail borders. Anesthesia: Lidocaine 1% plain; 1.5 mL and Marcaine 0.5% plain; 1.5 mL, digital block. Skin Prep: Betadine. Dressing: Silvadene; telfa; dry, sterile, compression dressing. Technique: Following skin prep, the toe was exsanguinated and a tourniquet was secured at the base of the toe. The affected nail border was freed, split with a nail splitter, and excised. Chemical matrixectomy was then performed with phenol and irrigated out with alcohol. The tourniquet was then removed and sterile  dressing applied. Disposition: Patient tolerated procedure well.    No follow-ups on file.

## 2021-10-21 ENCOUNTER — Other Ambulatory Visit: Payer: Self-pay

## 2021-10-21 ENCOUNTER — Ambulatory Visit (INDEPENDENT_AMBULATORY_CARE_PROVIDER_SITE_OTHER): Payer: 59

## 2021-10-21 ENCOUNTER — Ambulatory Visit (INDEPENDENT_AMBULATORY_CARE_PROVIDER_SITE_OTHER): Payer: 59 | Admitting: Podiatry

## 2021-10-21 DIAGNOSIS — M2142 Flat foot [pes planus] (acquired), left foot: Secondary | ICD-10-CM

## 2021-10-21 DIAGNOSIS — M2141 Flat foot [pes planus] (acquired), right foot: Secondary | ICD-10-CM

## 2021-10-22 ENCOUNTER — Ambulatory Visit: Payer: 59 | Admitting: Podiatry

## 2021-10-24 ENCOUNTER — Encounter: Payer: Self-pay | Admitting: Podiatry

## 2021-10-24 NOTE — Progress Notes (Signed)
°  Subjective:  Patient ID: Chad Long, male    DOB: Jan 19, 1991,  MRN: [id number]  Chief Complaint  Patient presents with   Flat Foot    Pt stated that he is doing okay but does not feel like he is healing fast enough     31 y.o. male presents with the above complaint. History confirmed with patient.  Overall doing well still has some pain in the right foot when he is on his feet quite a lot he is back to work full-time and thinks this is maybe has contributed to it.  Objective:  Physical Exam: warm, good capillary refill, no trophic changes or ulcerative lesions, normal DP and PT pulses, normal sensory exam, and well-healed surgical incisions, he has minimal swelling bilaterally says he has some tenderness in the right foot   New radiographs taken today show good healing and position of the left foot completed and good healing and position of the right foot with some lucency with graft resorption but no change in position and there is still good bridging across the graft site  Assessment:   1. Pes planus of both feet      Plan:  Patient was evaluated and treated and all questions answered.  Overall continues to do well now 9 months out from surgery on the left side and 6 months out from the right side.  Has improved since his last visit and pain.  No new changes on his radiographs.  Clinically he seems to be fairly well-healed.  Recommend he continue to increase his activity as tolerated and will reevaluate in 3 months.  Continue wearing orthotics.   Return in about 3 months (around 01/19/2022) for surgery follow up both feet (new x-rays).

## 2021-10-29 ENCOUNTER — Ambulatory Visit: Payer: Self-pay | Admitting: Medical

## 2021-11-02 ENCOUNTER — Encounter: Payer: Self-pay | Admitting: Medical

## 2021-11-02 ENCOUNTER — Ambulatory Visit (INDEPENDENT_AMBULATORY_CARE_PROVIDER_SITE_OTHER): Payer: 59 | Admitting: Medical

## 2021-11-02 VITALS — BP 120/80 | HR 69 | Resp 18 | Ht 71.0 in | Wt 246.0 lb

## 2021-11-02 DIAGNOSIS — Z0001 Encounter for general adult medical examination with abnormal findings: Secondary | ICD-10-CM

## 2021-11-02 DIAGNOSIS — L0291 Cutaneous abscess, unspecified: Secondary | ICD-10-CM | POA: Diagnosis not present

## 2021-11-02 DIAGNOSIS — F32A Depression, unspecified: Secondary | ICD-10-CM

## 2021-11-02 DIAGNOSIS — F419 Anxiety disorder, unspecified: Secondary | ICD-10-CM | POA: Diagnosis not present

## 2021-11-02 LAB — CBC WITH DIFFERENTIAL/PLATELET
Basophils Absolute: 0 10*3/uL (ref 0.0–0.1)
Basophils Relative: 0.5 % (ref 0.0–3.0)
Eosinophils Absolute: 0.5 10*3/uL (ref 0.0–0.7)
Eosinophils Relative: 6.8 % — ABNORMAL HIGH (ref 0.0–5.0)
HCT: 46.5 % (ref 39.0–52.0)
Hemoglobin: 15.6 g/dL (ref 13.0–17.0)
Lymphocytes Relative: 29.6 % (ref 12.0–46.0)
Lymphs Abs: 2.1 10*3/uL (ref 0.7–4.0)
MCHC: 33.4 g/dL (ref 30.0–36.0)
MCV: 88.4 fl (ref 78.0–100.0)
Monocytes Absolute: 0.4 10*3/uL (ref 0.1–1.0)
Monocytes Relative: 6.2 % (ref 3.0–12.0)
Neutro Abs: 3.9 10*3/uL (ref 1.4–7.7)
Neutrophils Relative %: 56.9 % (ref 43.0–77.0)
Platelets: 219 10*3/uL (ref 150.0–400.0)
RBC: 5.26 Mil/uL (ref 4.22–5.81)
RDW: 13.2 % (ref 11.5–15.5)
WBC: 6.9 10*3/uL (ref 4.0–10.5)

## 2021-11-02 LAB — COMPREHENSIVE METABOLIC PANEL
ALT: 37 U/L (ref 0–53)
AST: 23 U/L (ref 0–37)
Albumin: 4.3 g/dL (ref 3.5–5.2)
Alkaline Phosphatase: 69 U/L (ref 39–117)
BUN: 13 mg/dL (ref 6–23)
CO2: 28 mEq/L (ref 19–32)
Calcium: 9.4 mg/dL (ref 8.4–10.5)
Chloride: 103 mEq/L (ref 96–112)
Creatinine, Ser: 0.93 mg/dL (ref 0.40–1.50)
GFR: 111.24 mL/min (ref >60.00)
Glucose, Bld: 100 mg/dL — ABNORMAL HIGH (ref 70–99)
Potassium: 4.4 mEq/L (ref 3.5–5.1)
Sodium: 137 mEq/L (ref 135–145)
Total Bilirubin: 0.5 mg/dL (ref 0.2–1.2)
Total Protein: 7.1 g/dL (ref 6.0–8.3)

## 2021-11-02 LAB — LIPID PANEL
Cholesterol: 167 mg/dL (ref 0–200)
HDL: 42 mg/dL (ref >39.00)
LDL Cholesterol: 101 mg/dL — ABNORMAL HIGH (ref 0–99)
NonHDL: 124.58
Total CHOL/HDL Ratio: 4
Triglycerides: 117 mg/dL (ref 0.0–149.0)
VLDL: 23.4 mg/dL (ref 0.0–40.0)

## 2021-11-02 MED ORDER — DOXYCYCLINE HYCLATE 100 MG PO TABS: 100.0000 mg | ORAL_TABLET | Freq: Two times a day (BID) | ORAL | 0 refills | Status: AC

## 2021-11-02 NOTE — Patient Instructions (Addendum)
For you wellness exam today I have ordered cbc, cmp and  lipid panel.  Flu vaccine declined. Also declines covid.  Recommend exercise and healthy diet.  We will let you know lab results as they come in.  Follow up date appointment will be determined after lab review.    Did place referral to general surgeon to evaluate prior cyst area and present probable abscess that is partially draining. Rx doxycycline pending appointment. If are worsens let Chad Long know.  Glad to here anxiety and depression resolved. No longer needing medication. If mood worsens again please let Chad Long know.   Preventive Care 30-53 Years Old, Male Preventive care refers to lifestyle choices and visits with your health care provider that can promote health and wellness. Preventive care visits are also called wellness exams. What can I expect for my preventive care visit? Counseling During your preventive care visit, your health care provider may ask about your: Medical history, including: Past medical problems. Family medical history. Current health, including: Emotional well-being. Home life and relationship well-being. Sexual activity. Lifestyle, including: Alcohol, nicotine or tobacco, and drug use. Access to firearms. Diet, exercise, and sleep habits. Safety issues such as seatbelt and bike helmet use. Sunscreen use. Work and work Astronomer. Physical exam Your health care provider may check your: Height and weight. These may be used to calculate your BMI (body mass index). BMI is a measurement that tells if you are at a healthy weight. Waist circumference. This measures the distance around your waistline. This measurement also tells if you are at a healthy weight and may help predict your risk of certain diseases, such as type 2 diabetes and high blood pressure. Heart rate and blood pressure. Body temperature. Skin for abnormal spots. What immunizations do I need? Vaccines are usually given at various ages,  according to a schedule. Your health care provider will recommend vaccines for you based on your age, medical history, and lifestyle or other factors, such as travel or where you work. What tests do I need? Screening Your health care provider may recommend screening tests for certain conditions. This may include: Lipid and cholesterol levels. Diabetes screening. This is done by checking your blood sugar (glucose) after you have not eaten for a while (fasting). Hepatitis B test. Hepatitis C test. HIV (human immunodeficiency virus) test. STI (sexually transmitted infection) testing, if you are at risk. Talk with your health care provider about your test results, treatment options, and if necessary, the need for more tests. Follow these instructions at home: Eating and drinking  Eat a healthy diet that includes fresh fruits and vegetables, whole grains, lean protein, and low-fat dairy products. Drink enough fluid to keep your urine pale yellow. Take vitamin and mineral supplements as recommended by your health care provider. Do not drink alcohol if your health care provider tells you not to drink. If you drink alcohol: Limit how much you have to 0-2 drinks a day. Know how much alcohol is in your drink. In the [place] one drink equals one 12 oz bottle of beer (355 mL), one 5 oz glass of wine (148 mL), or one 1 oz glass of hard liquor (44 mL). Lifestyle Brush your teeth every morning and night with fluoride toothpaste. Floss one time each day. Exercise for at least 30 minutes 5 or more days each week. Do not use any products that contain nicotine or tobacco. These products include cigarettes, chewing tobacco, and vaping devices, such as e-cigarettes. If you need help quitting, ask your  health care provider. Do not use drugs. If you are sexually active, practice safe sex. Use a condom or other form of protection to prevent STIs. Find healthy ways to manage stress, such as: Meditation, yoga, or  listening to music. Journaling. Talking to a trusted person. Spending time with friends and family. Minimize exposure to UV radiation to reduce your risk of skin cancer. Safety Always wear your seat belt while driving or riding in a vehicle. Do not drive: If you have been drinking alcohol. Do not ride with someone who has been drinking. If you have been using any mind-altering substances or drugs. While texting. When you are tired or distracted. Wear a helmet and other protective equipment during sports activities. If you have firearms in your house, make sure you follow all gun safety procedures. Seek help if you have been physically or sexually abused. What's next? Go to your health care provider once a year for an annual wellness visit. Ask your health care provider how often you should have your eyes and teeth checked. Stay up to date on all vaccines. This information is not intended to replace advice given to you by your health care provider. Make sure you discuss any questions you have with your health care provider. Document Revised: 03/24/2021 Document Reviewed: 03/24/2021 Elsevier Patient Education  2022 [employer].

## 2021-11-02 NOTE — Progress Notes (Signed)
Subjective:    Patient ID: Chad Long, male    DOB: [birth year], 31 y.o.   MRN: [id number]  HPI  Pt here asking question about his general health. Lab etc so decided to go ahead and do wellness. He is fasting.  Pt works as Curator. Not exercising apart from work. Pt admits not eating very healthy. Fast food once a day. Pt stopped smoking since December. Pt has been drinking much less.  Pt in for recent rt side buttock are cyst. In 2014 he had cyst removed before he started [employer]. Are did well up until this September when area started to intermittently bleed with yellow dc. Some mild pain. No fever, no chills and no sweats.  Declines flu vaccine. No covid vaccine and declines.     Review of Systems  Constitutional:  Negative for chills, fatigue and fever.  Respiratory:  Negative for cough, chest tightness and wheezing.   Cardiovascular:  Negative for chest pain and palpitations.  Gastrointestinal:  Negative for abdominal pain, blood in stool and nausea.  Genitourinary:  Negative for dysuria, flank pain, frequency, hematuria, penile discharge and penile swelling.  Musculoskeletal:  Negative for back pain.  Skin:  Negative for rash.       See hpi and exam  Neurological:  Negative for dizziness, seizures and light-headedness.  Psychiatric/Behavioral:  Negative for agitation, confusion, dysphoric mood, sleep disturbance and suicidal ideas. The patient is not nervous/anxious.        Mood is now better compared to past. No meds needed for mood or anxiety.    Past Medical History:  Diagnosis Date   Allergic rhinitis    Anxiety    Depression    Flat foot    History of 2019 novel coronavirus disease (COVID-19) 09/17/2019   positive test result in epic, per pt had mild symptoms that resolved   OSA (obstructive sleep apnea)    followed by pulmonologist---Llindsey Thornton Papas NP---  pt study in epic 06-22-2020 mild osa and pt titrated for cpap,  on 03-17-2021 pt stated his cpap is on order  and have not received yet     Social History   Socioeconomic History   Marital status: Married    Spouse name: Not on file   Number of children: Not on file   Years of education: Not on file   Highest education level: Not on file  Occupational History   Occupation: Curator.  Tobacco Use   Smoking status: Never   Smokeless tobacco: Former    Types: Chew    Quit date: 02/07/2015  Vaping Use   Vaping Use: Every day   Start date: 02/07/2015   Devices: Vuse  Substance and Sexual Activity   Alcohol use: Yes    Comment: occasionAl   Drug use: Not on file    Comment: 03-17-2021  per pt last used teen   Sexual activity: Yes  Other Topics Concern   Not on file  Social History Narrative   Not on file   Social Determinants of Health   Financial Resource Strain: Not on file  Food Insecurity: Not on file  Transportation Needs: Not on file  Physical Activity: Not on file  Stress: Not on file  Social Connections: Not on file  Intimate Partner Violence: Not on file    Past Surgical History:  Procedure Laterality Date   FINGER TENDON REPAIR Left 2010   index tendon repair from laceration   FLAT FOOT RECONSTRUCTION-TAL GASTROC RECESSION Left 11/20/2020   Procedure: FLAT  FOOT RECONSTRUCTION-TAL GASTROC RECESSION;  Surgeon: Criselda Peaches, DPM;  Location: [facility];  Service: Podiatry;  Laterality: Left;  WITH A BLOCK   FLAT FOOT RECONSTRUCTION-TAL GASTROC RECESSION Right 03/19/2021   Procedure: FLAT FOOT RECONSTRUCTION WITH BONE CUTS IN HEEL AND MIDFOOT WITH CADAVER GRAFT, LENTHENING OF CALF MUSCLE;  Surgeon: Criselda Peaches, DPM;  Location: [facility];  Service: Podiatry;  Laterality: Right;  BLOCK AND FOOT   PILONIDAL CYST EXCISION  09-19-2013  @ [facility]    Family History  Problem Relation Age of Onset   Diabetes Father    Diabetes Paternal Aunt    Diabetes Paternal Grandmother     No Known Allergies  No current outpatient medications on  file prior to visit.   No current facility-administered medications on file prior to visit.    BP (!) 133/91    Pulse 69    Resp 18    Ht 5\' 11"  (1.803 m)    Wt 246 lb (111.6 kg)    SpO2 100%    BMI 34.31 kg/m       Objective:   Physical Exam  General Mental Status- Alert. General Appearance- Not in acute distress.   Skin Left upper buttuck area has dimpling to skin from prior surgery. Medial to this slight indurated non red area. He reports some drainage midline. I don't see any presenty.  Neck Carotid Arteries- Normal color. Moisture- Normal Moisture. No carotid bruits. No JVD.  Chest and Lung Exam Auscultation: Breath Sounds:-Normal.  Cardiovascular Auscultation:Rythm- Regular. Murmurs & Other Heart Sounds:Auscultation of the heart reveals- No Murmurs.  Abdomen Inspection:-Inspeection Normal. Palpation/Percussion:Note:No mass. Palpation and Percussion of the abdomen reveal- Non Tender, Non Distended + BS, no rebound or guarding.   Neurologic Cranial Nerve exam:- CN III-XII intact(No nystagmus), symmetric smile. Strength:- 5/5 equal and symmetric strength both upper and lower extremities.       Assessment & Plan:   Patient Instructions  For you wellness exam today I have ordered cbc, cmp and  lipid panel.  Flu vaccine declined. Also declines covid.  Recommend exercise and healthy diet.  We will let you know lab results as they come in.  Follow up date appointment will be determined after lab review.    Did place referral to general surgeon to evaluate prior cyst area and present probable abscess that is partially draining. Rx doxycycline pending appointment. If are worsens let us know.  Glad to here anxiety and depression resolved. No longer needing medication. If mood worsens again please let us know.   Mackie Pai, [place]   [phone] charge in addition to requested wellness exam. Placed referral to surgeon as well as starting antibiotics. Also addressed  review anxiety and depression.

## 2022-01-20 ENCOUNTER — Ambulatory Visit (INDEPENDENT_AMBULATORY_CARE_PROVIDER_SITE_OTHER): Payer: 59 | Admitting: Podiatry

## 2022-01-20 ENCOUNTER — Ambulatory Visit (INDEPENDENT_AMBULATORY_CARE_PROVIDER_SITE_OTHER): Payer: 59

## 2022-01-20 DIAGNOSIS — M21862 Other specified acquired deformities of left lower leg: Secondary | ICD-10-CM | POA: Diagnosis not present

## 2022-01-20 DIAGNOSIS — M2142 Flat foot [pes planus] (acquired), left foot: Secondary | ICD-10-CM

## 2022-01-20 DIAGNOSIS — M62462 Contracture of muscle, left lower leg: Secondary | ICD-10-CM

## 2022-01-20 DIAGNOSIS — M21861 Other specified acquired deformities of right lower leg: Secondary | ICD-10-CM

## 2022-01-20 DIAGNOSIS — M9689 Other intraoperative and postprocedural complications and disorders of the musculoskeletal system: Secondary | ICD-10-CM | POA: Diagnosis not present

## 2022-01-20 DIAGNOSIS — M62461 Contracture of muscle, right lower leg: Secondary | ICD-10-CM

## 2022-01-20 DIAGNOSIS — M2141 Flat foot [pes planus] (acquired), right foot: Secondary | ICD-10-CM | POA: Diagnosis not present

## 2022-01-24 ENCOUNTER — Encounter: Payer: Self-pay | Admitting: Podiatry

## 2022-01-24 NOTE — Progress Notes (Signed)
?  Subjective:  ?Patient ID: Chad Long, male    DOB: 01/21/1991,  MRN: [id number] ? ?Chief Complaint  ?Patient presents with  ? Flat Foot  ?  3 month follow up, bilateral  ? ? ?31 y.o. male presents with the above complaint. History confirmed with patient.  Has continued to make slow but gradual progress.  Still worse his orthotics on.  He is still having a decent amount of pain in the right foot on the lateral side near the Evans incision ? ?Objective:  ?Physical Exam: ?warm, good capillary refill, no trophic changes or ulcerative lesions, normal DP and PT pulses, normal sensory exam, and well-healed surgical incisions, he has minimal swelling bilaterally says he has some tenderness in the right foot ? ? ?New radiographs taken today show good healing and position of the left foot completed and good healing and position of the right foot with some lucency with graft resorption, this is increased since last visit ?Assessment:  ? ?1. Gastrocnemius equinus of right lower extremity   ?2. 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 Pes planus of both feet   ? ? ? ?Plan:  ?Patient was evaluated and treated and all questions answered. ? ?Recommend he continue wearing his orthotics.  I did recommend a CT scan of the right foot.  The left foot is doing well and is all healed on his radiographs.  His right foot radiographs show some lucency around the Evans graft site and I would like to evaluate for the level of bone healing.  We also discussed noninvasive bone stimulation if this is poor.  We discussed this may not be covered by his insurance.  Finally we also discussed surgical revision if this is not successful.  We will discuss further treatment after his CT scan ? ?No follow-ups on file.  ? ? ?

## 2022-02-24 ENCOUNTER — Telehealth: Payer: Self-pay | Admitting: Podiatry

## 2022-02-24 NOTE — Telephone Encounter (Signed)
Patient called he would like to speak to Dr Lilian Kapur about his condition, would not give any other details, he did not want to set up appointment at this time.

## 2022-07-04 IMAGING — CT CT FOOT*L* W/O CM
3 of 5 series · 11 of 34 positions shown, 13 images · non-contrast
Comparison: X-ray 05/01/2020.  MRI 05/30/2020

CLINICAL DATA: Chronic left foot pain

EXAM:
CT OF THE LEFT FOOT WITHOUT CONTRAST
TECHNIQUE: Multidetector CT imaging of the left foot was performed according to
the standard protocol. Multiplanar CT image reconstructions were
also generated.

[Series 7: sfov lower extremity 2.00 br40 s3 soft · axial · 0.59mm/px · z∈[+660,+754]mm · 4 of 69 slices shown, 5 images (1 of 2)]
[im 11/69  soft-tissue]
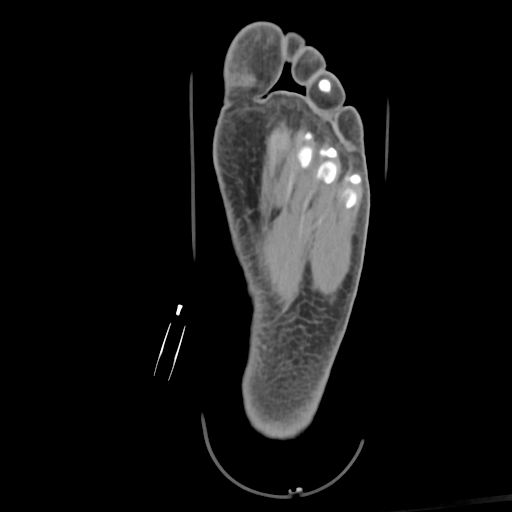
[im 11/69  bone]
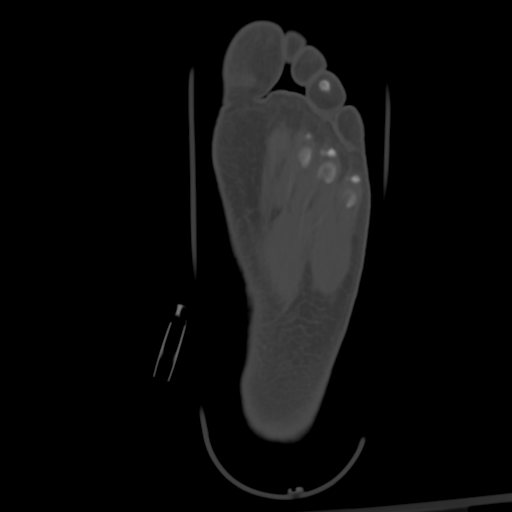
[im 27/69  bone]
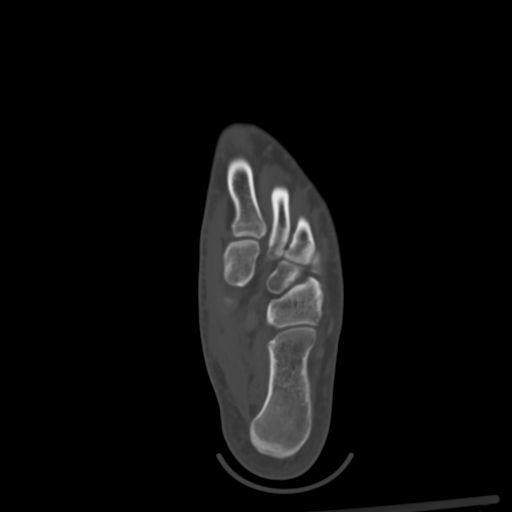
[im 42/69  bone]
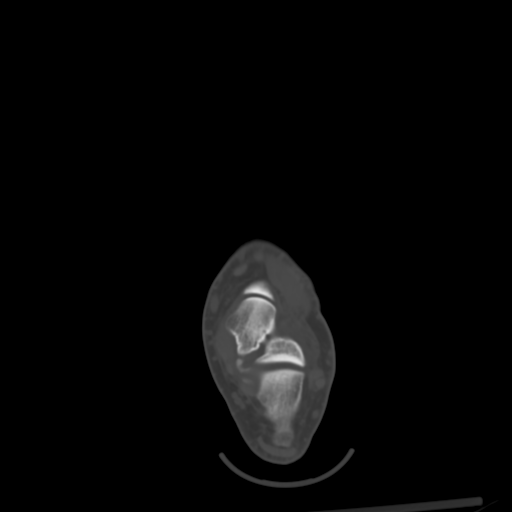
[im 58/69  bone]
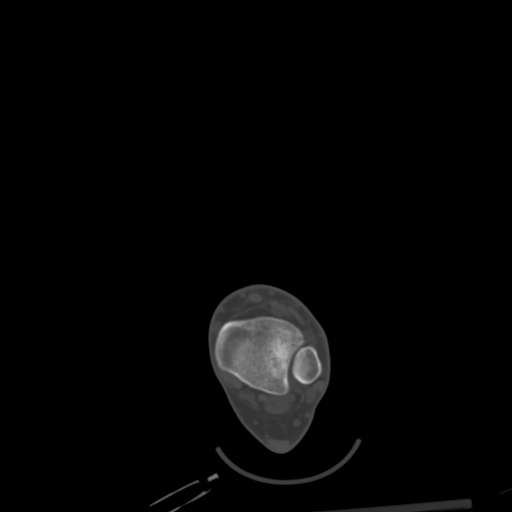

[Series 15: sfov lower extremity 2.00 br40 s3 soft · sagittal · 0.27mm/px · 5 of 85 slices shown, 6 images (2 of 2)]
[im 29/85  bone]
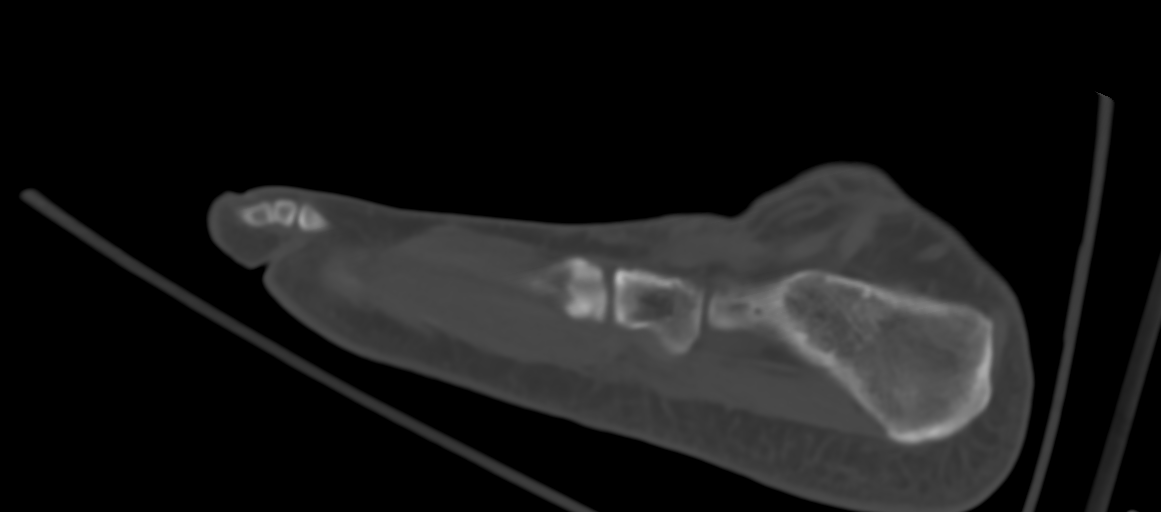
[im 36/85  bone]
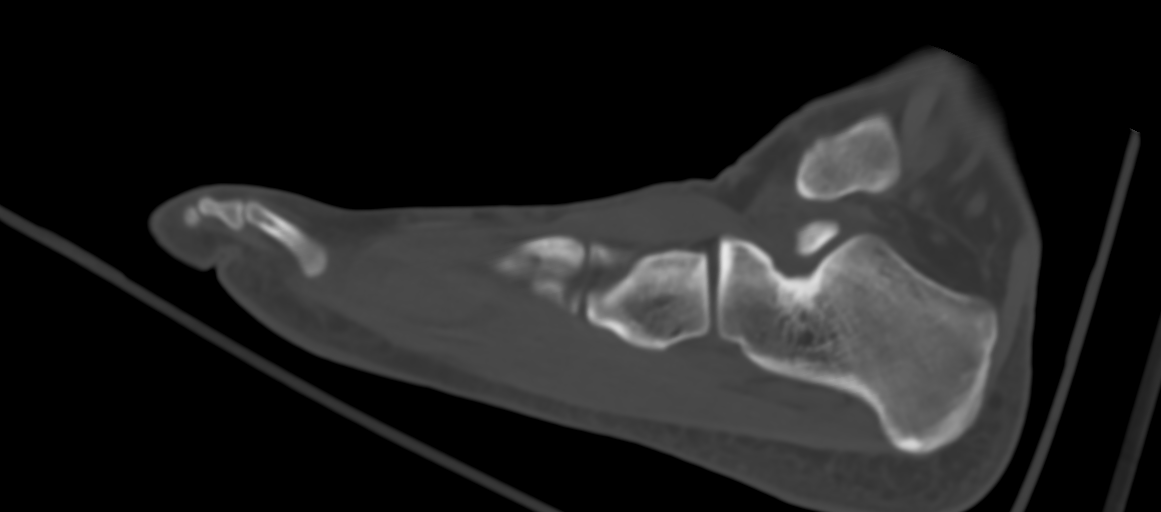
[im 43/85  soft-tissue]
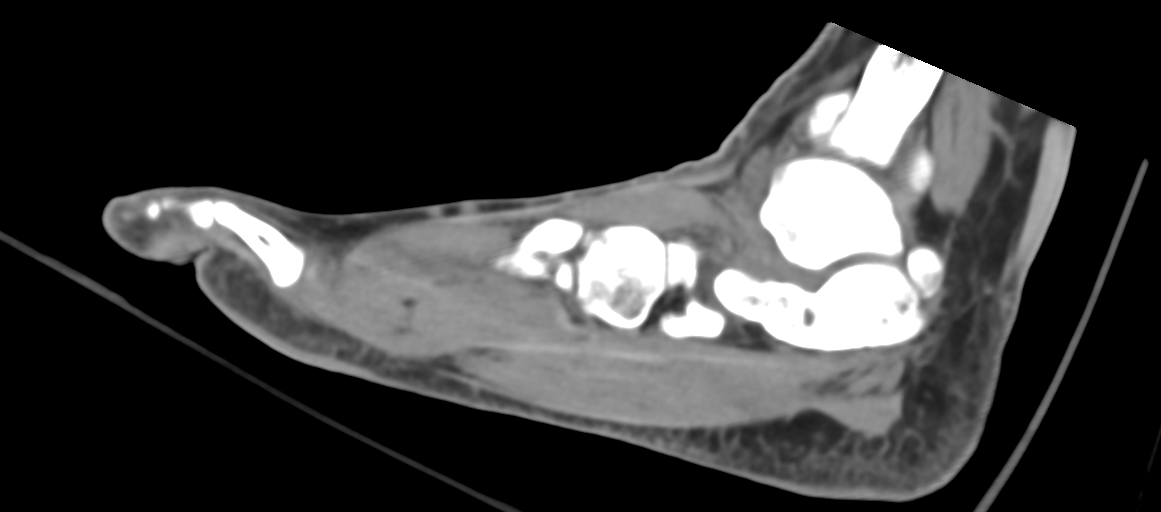
[im 43/85  bone]
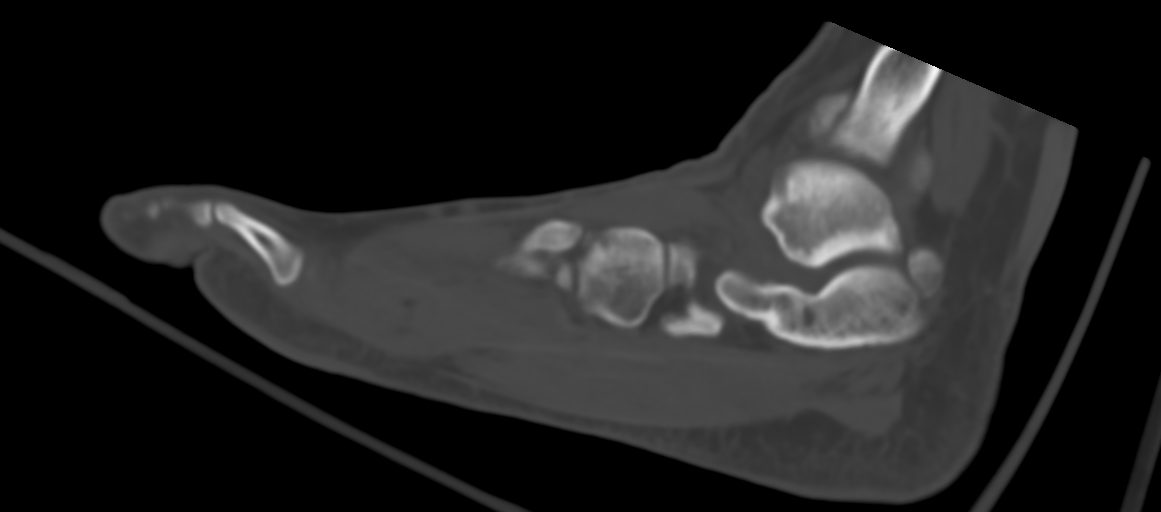
[im 49/85  bone]
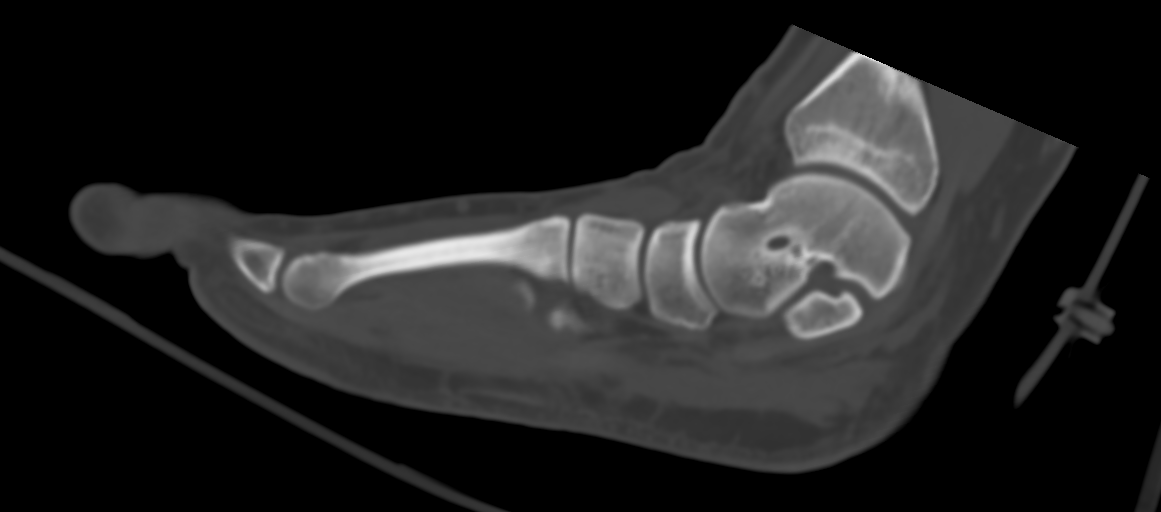
[im 56/85  bone]
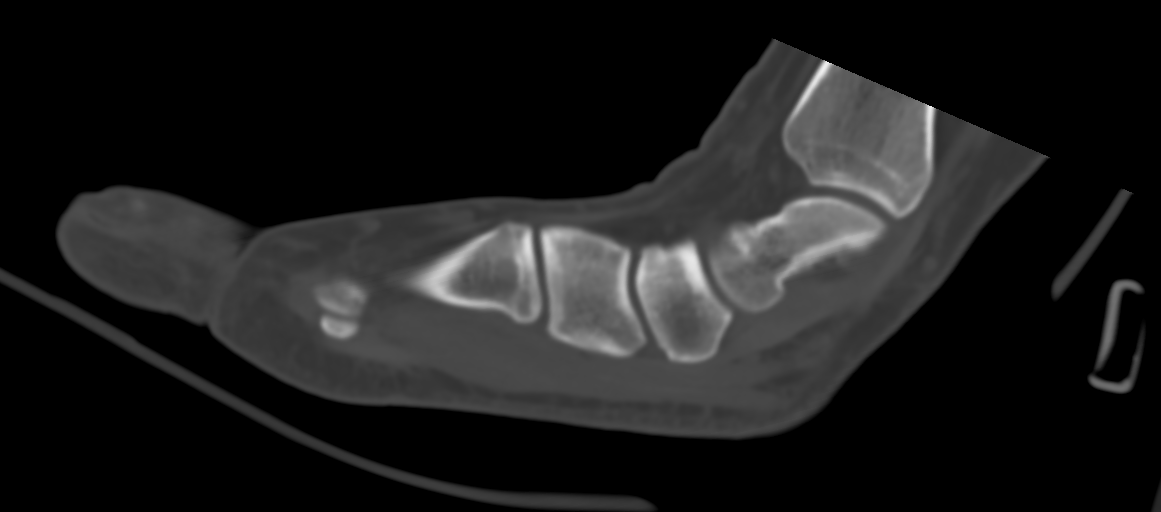

[Series 1004: axial bone · coronal · 0.59mm/px · 2 of 202 slices shown]
[im 9/202  bone]
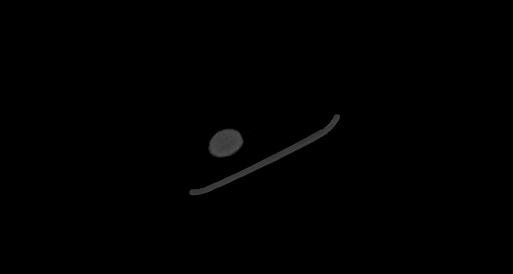
[im 146/202  bone]
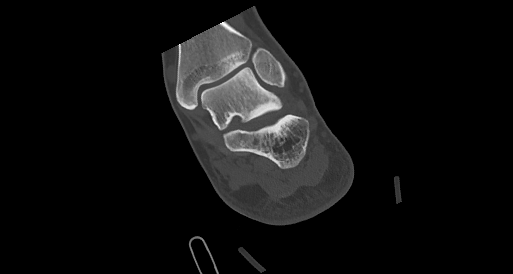

[11 of 34 positions shown; findings below may reference images not displayed]

FINDINGS: Bones/Joint/Cartilage

No acute fracture. No dislocation. Ankle mortise is congruent. No
talar dome osteochondral lesion. Subtalar joint spaces are
maintained. Subcortical cystic changes adjacent to the middle
subtalar joint. Prominent os trigonum. No tibiotalar or subtalar
joint effusion is seen. Loss of fat density within the region of the
sinus tarsi as previously characterized on MRI. No evidence of bony
tarsal coalition.

Joint spaces of the midfoot and forefoot are maintained without
significant arthropathy. No cortical thickening or periostitis.

Ligaments

Suboptimally assessed by CT.

Muscles and Tendons

Normal appearance of the tendinous structures by CT. No muscle
atrophy or fatty infiltration.

Soft tissues

No soft tissue edema or fluid collection.
IMPRESSION: 1. No acute osseous abnormality or significant degenerative changes
of the left foot.
2. No evidence of tarsal coalition.
3. Loss of fat density within the region of the sinus tarsi as
previously characterized on MRI.
4. Os trigonum.

## 2023-07-19 ENCOUNTER — Emergency Department (HOSPITAL_COMMUNITY): Payer: Non-veteran care

## 2023-07-19 ENCOUNTER — Emergency Department (HOSPITAL_BASED_OUTPATIENT_CLINIC_OR_DEPARTMENT_OTHER)
Admission: EM | Admit: 2023-07-19 | Discharge: 2023-07-19 | Disposition: A | Discharge status: Home / Self Care | Payer: Non-veteran care | Attending: Emergency Medicine | Admitting: Emergency Medicine

## 2023-07-19 ENCOUNTER — Encounter (HOSPITAL_BASED_OUTPATIENT_CLINIC_OR_DEPARTMENT_OTHER): Payer: Self-pay | Admitting: Pediatrics

## 2023-07-19 ENCOUNTER — Emergency Department (HOSPITAL_BASED_OUTPATIENT_CLINIC_OR_DEPARTMENT_OTHER): Payer: Non-veteran care

## 2023-07-19 ENCOUNTER — Other Ambulatory Visit: Payer: Self-pay

## 2023-07-19 DIAGNOSIS — N50811 Right testicular pain: Secondary | ICD-10-CM | POA: Diagnosis present

## 2023-07-19 LAB — URINALYSIS, W/ REFLEX TO CULTURE (INFECTION SUSPECTED)
Bilirubin Urine: NEGATIVE
Glucose, UA: NEGATIVE mg/dL
Hgb urine dipstick: NEGATIVE
Ketones, ur: NEGATIVE mg/dL
Leukocytes,Ua: NEGATIVE
Nitrite: NEGATIVE
Protein, ur: NEGATIVE mg/dL
RBC / HPF: NONE SEEN RBC/hpf (ref 0–5)
Specific Gravity, Urine: 1.01 (ref 1.005–1.030)
WBC, UA: NONE SEEN WBC/hpf (ref 0–5)
pH: 6 (ref 5.0–8.0)

## 2023-07-19 MED ORDER — ONDANSETRON HCL 4 MG/2ML IJ SOLN: 4.0000 mg | Freq: Once | INTRAMUSCULAR | Status: DC

## 2023-07-19 MED ORDER — ONDANSETRON 4 MG PO TBDP
4.0000 mg | ORAL_TABLET | Freq: Once | ORAL | Status: AC
  Administered 2023-07-19: 4 mg via ORAL
  Filled 2023-07-19: qty 1

## 2023-07-19 MED ORDER — CEPHALEXIN 250 MG PO CAPS
500.0000 mg | ORAL_CAPSULE | Freq: Once | ORAL | Status: AC
  Administered 2023-07-19: 500 mg via ORAL
  Filled 2023-07-19: qty 2

## 2023-07-19 MED ORDER — KETOROLAC TROMETHAMINE 15 MG/ML IJ SOLN
15.0000 mg | Freq: Once | INTRAMUSCULAR | Status: AC
  Administered 2023-07-19: 15 mg via INTRAMUSCULAR
  Filled 2023-07-19: qty 1

## 2023-07-19 MED ORDER — CEPHALEXIN 500 MG PO CAPS: 500.0000 mg | ORAL_CAPSULE | Freq: Two times a day (BID) | ORAL | 0 refills | Status: AC

## 2023-07-19 NOTE — ED Notes (Signed)
Patient back in the room from US

## 2023-07-19 NOTE — ED Triage Notes (Signed)
C/O right sided testicular pain and groin started yesterday, comes and go in nature.

## 2023-07-19 NOTE — ED Notes (Signed)
US at bedside

## 2023-07-19 NOTE — ED Provider Notes (Signed)
[facility] Provider Note   CSN: [id number] Arrival date & time: 07/19/23  1509     History  Chief Complaint  Patient presents with   Testicle Pain    Chad Long is a 32 y.o. male.  With a history of anxiety, depression, OSA presenting to the ED for evaluation of right-sided testicle pain.  He states he has had the symptoms intermittently since March.  He states approximately 3 hours prior to arrival he developed right testicle pain that was worse with palpation.  This was sudden in onset.  No preceding trauma.  Reports the pain is worse with coughing and movement.  It occasionally radiates to the right groin.  No history of inguinal hernia.  He is sexually active with 1 male partner who is his wife.  He denies any concern for STIs.  He denies dysuria, frequency, urgency, pubic or penile rashes or lesions.   Testicle Pain       Home Medications Prior to Admission medications   Medication Sig Start Date End Date Taking? Authorizing Provider  cephALEXin (KEFLEX) 500 MG capsule Take 1 capsule (500 mg total) by mouth 2 (two) times daily for 5 days. 07/19/23 07/24/23 Yes Marsella Suman, Edsel Petrin, PA-C  doxycycline (VIBRA-TABS) 100 MG tablet Take 1 tablet (100 mg total) by mouth 2 (two) times daily. 11/02/21   Saguier, Ramon Dredge, PA-C      Allergies    Patient has no known allergies.    Review of Systems   Review of Systems  Genitourinary:  Positive for testicular pain.  All other systems reviewed and are negative.   Physical Exam Updated Vital Signs BP (!) 116/93 (BP Location: Right Arm)   Pulse 92   Temp 98.2 F (36.8 C) (Oral)   Resp 18   Ht 5\' 11"  (1.803 m)   Wt 113.4 kg   SpO2 100%   BMI 34.87 kg/m  Physical Exam Vitals and nursing note reviewed. Exam conducted with a chaperone present (Tiiu, RN).  Constitutional:      General: He is not in acute distress.    Appearance: Normal appearance. He is normal weight. He is not  ill-appearing.  HENT:     Head: Normocephalic and atraumatic.  Pulmonary:     Effort: Pulmonary effort is normal. No respiratory distress.  Abdominal:     General: Abdomen is flat.  Genitourinary:    Comments: No pubic or penile rashes or lesions.  Right testicle exquisitely tender to palpation.  No obvious inguinal hernia on palpation.  Cremasteric reflex intact bilaterally.  No scrotal erythema or swelling.  No penile discharge expressed. Musculoskeletal:        General: Normal range of motion.     Cervical back: Neck supple.  Skin:    General: Skin is warm and dry.  Neurological:     Mental Status: He is alert and oriented to person, place, and time.  Psychiatric:        Mood and Affect: Mood normal.        Behavior: Behavior normal.     ED Results / Procedures / Treatments   Labs (all labs ordered are listed, but only abnormal results are displayed) Labs Reviewed  URINALYSIS, W/ REFLEX TO CULTURE (INFECTION SUSPECTED) - Abnormal; Notable for the following components:      Result Value   Bacteria, UA RARE (*)    All other components within normal limits  URINE CULTURE    EKG None  Radiology  US SCROTUM W/DOPPLER  Result Date: 07/19/2023 CLINICAL DATA:  Right testicle pain since yesterday EXAM: SCROTAL ULTRASOUND DOPPLER ULTRASOUND OF THE TESTICLES TECHNIQUE: Complete ultrasound examination of the testicles, epididymis, and other scrotal structures was performed. Color and spectral Doppler ultrasound were also utilized to evaluate blood flow to the testicles. COMPARISON:  None Available. FINDINGS: Right testicle Measurements: 4.1 x 2.3 x 2.7 cm. No mass or microlithiasis visualized. Left testicle Measurements: 4.3 x 2.2 x 2.5 cm. No mass or microlithiasis visualized. Right epididymis:  Normal in size and appearance. Left epididymis:  Normal in size and appearance. Hydrocele:  Trace bilateral hydroceles. Varicocele:  None visualized. Pulsed Doppler interrogation of both testes  demonstrates normal low resistance arterial and venous waveforms bilaterally. IMPRESSION: Unremarkable testicular ultrasound. Electronically Signed   By: Minerva Fester M.D.   On: 07/19/2023 20:13    Procedures Procedures    Medications Ordered in ED Medications  cephALEXin (KEFLEX) capsule 500 mg (has no administration in time range)  ketorolac (TORADOL) 15 MG/ML injection 15 mg (15 mg Intramuscular Given 07/19/23 1534)  ondansetron (ZOFRAN-ODT) disintegrating tablet 4 mg (4 mg Oral Given 07/19/23 1534)    ED Course/ Medical Decision Making/ A&P                                 Medical Decision Making Amount and/or Complexity of Data Reviewed Radiology: ordered.  Risk Prescription drug management.   This patient presents to the ED for concern of right testicle pain, this involves an extensive number of treatment options, and is a complaint that carries with it a high risk of complications and morbidity.  The differential diagnosis of Emergent testicle pain includes but is not limited to Cellulitis, Fournier's gangrene, epididymitis, orchitis, testicular torsion, appendage torsion, trauma, indirect hernia.   My initial workup includes urinalysis, ultrasound, symptom control  Additional history obtained from: Nursing notes from this visit.  I ordered, reviewed and interpreted labs which include: Urinalysis  I ordered imaging studies including scrotal ultrasound I independently visualized and interpreted imaging which showed negative I agree with the radiologist interpretation  Afebrile, hemodynamically stable.  32 year old male presenting for evaluation of right testicle pain.  This has been intermittent for the past few months.  Pain was sudden onset today.  It is worse with movement and coughing.  On exam, he is exquisitely tender to the posterior of the testicle.  No Bell Clapper deformity.  Cremasteric reflexes intact bilaterally.  Ultrasound reveals strong blood flow.  Low  suspicion for testicular torsion.  He denies any urinary symptoms.  Urine was with rare bacteria but was otherwise negative.  Will culture this as I have high clinical suspicion for epididymitis.  Will treat for epididymitis as well.  He was encouraged to follow-up with urology for reevaluation of his symptoms.  He was given return precautions.  Stable at discharge.  At this time there does not appear to be any evidence of an acute emergency medical condition and the patient appears stable for discharge with appropriate outpatient follow up. Diagnosis was discussed with patient who verbalizes understanding of care plan and is agreeable to discharge. I have discussed return precautions with patient who verbalizes understanding. Patient encouraged to follow-up with their PCP within 1 week. All questions answered.  Note: Portions of this report may have been transcribed using voice recognition software. Every effort was made to ensure accuracy; however, inadvertent computerized transcription errors may still be present.  Final Clinical Impression(s) / ED Diagnoses Final diagnoses:  Pain in right testicle    Rx / DC Orders ED Discharge Orders          Ordered    cephALEXin (KEFLEX) 500 MG capsule  2 times daily        07/19/23 2035              Mora Bellman 07/19/23 2039    Melene Plan, DO 07/19/23 2049

## 2023-07-19 NOTE — Discharge Instructions (Addendum)
You have been seen today for your complaint of right testicle pain. Your lab work was reassuring.. Your imaging was reassuring and showed no abnormalities. Your discharge medications include keflex. This is an antibiotic. You should take it as prescribed. You should take it for the entire duration of the prescription. This may cause an upset stomach. This is normal. You may take this with food. You may also eat yogurt to prevent diarrhea. Follow up with: Dr. Laverle Patter.  He is a urologist.  Call to schedule a follow-up appointment Please seek immediate medical care if you develop any of the following symptoms: New or worsening pain, fevers At this time there does not appear to be the presence of an emergent medical condition, however there is always the potential for conditions to change. Please read and follow the below instructions.  Do not take your medicine if  develop an itchy rash, swelling in your mouth or lips, or difficulty breathing; call 911 and seek immediate emergency medical attention if this occurs.  You may review your lab tests and imaging results in their entirety on your MyChart account.  Please discuss all results of fully with your primary care provider and other specialist at your follow-up visit.  Note: Portions of this text may have been transcribed using voice recognition software. Every effort was made to ensure accuracy; however, inadvertent computerized transcription errors may still be present.

## 2023-07-20 LAB — URINE CULTURE: Culture: NO GROWTH

## 2023-08-04 ENCOUNTER — Encounter (HOSPITAL_BASED_OUTPATIENT_CLINIC_OR_DEPARTMENT_OTHER): Payer: Self-pay

## 2023-08-04 ENCOUNTER — Emergency Department (HOSPITAL_BASED_OUTPATIENT_CLINIC_OR_DEPARTMENT_OTHER)
Admission: EM | Admit: 2023-08-04 | Discharge: 2023-08-05 | Disposition: A | Discharge status: Home / Self Care | Payer: No Typology Code available for payment source | Attending: Emergency Medicine | Admitting: Emergency Medicine

## 2023-08-04 ENCOUNTER — Other Ambulatory Visit: Payer: Self-pay

## 2023-08-04 DIAGNOSIS — Z20822 Contact with and (suspected) exposure to covid-19: Secondary | ICD-10-CM | POA: Insufficient documentation

## 2023-08-04 DIAGNOSIS — R059 Cough, unspecified: Secondary | ICD-10-CM | POA: Diagnosis present

## 2023-08-04 DIAGNOSIS — J069 Acute upper respiratory infection, unspecified: Secondary | ICD-10-CM | POA: Insufficient documentation

## 2023-08-04 LAB — SARS CORONAVIRUS 2 BY RT PCR: SARS Coronavirus 2 by RT PCR: NEGATIVE

## 2023-08-04 LAB — GROUP A STREP BY PCR: Group A Strep by PCR: NOT DETECTED

## 2023-08-04 MED ORDER — LACTATED RINGERS IV BOLUS
1000.0000 mL | Freq: Once | INTRAVENOUS | Status: AC
  Administered 2023-08-04: 1000 mL via INTRAVENOUS

## 2023-08-04 MED ORDER — DIPHENHYDRAMINE HCL 50 MG/ML IJ SOLN
12.5000 mg | Freq: Once | INTRAMUSCULAR | Status: AC
  Administered 2023-08-04: 12.5 mg via INTRAVENOUS
  Filled 2023-08-04: qty 1

## 2023-08-04 MED ORDER — ONDANSETRON 4 MG PO TBDP
8.0000 mg | ORAL_TABLET | Freq: Once | ORAL | Status: AC
  Administered 2023-08-04: 8 mg via ORAL
  Filled 2023-08-04: qty 2

## 2023-08-04 MED ORDER — ACETAMINOPHEN 500 MG PO TABS
1000.0000 mg | ORAL_TABLET | Freq: Once | ORAL | Status: AC
  Administered 2023-08-04: 1000 mg via ORAL
  Filled 2023-08-04: qty 2

## 2023-08-04 MED ORDER — PROCHLORPERAZINE EDISYLATE 10 MG/2ML IJ SOLN
10.0000 mg | Freq: Once | INTRAMUSCULAR | Status: AC
  Administered 2023-08-04: 10 mg via INTRAVENOUS
  Filled 2023-08-04: qty 2

## 2023-08-04 MED ORDER — KETOROLAC TROMETHAMINE 15 MG/ML IJ SOLN
15.0000 mg | Freq: Once | INTRAMUSCULAR | Status: AC
  Administered 2023-08-04: 15 mg via INTRAVENOUS
  Filled 2023-08-04: qty 1

## 2023-08-04 NOTE — Discharge Instructions (Addendum)
Your symptoms improved after medications in the emergency department.  Take as 1000mg  of Tylenol every 6-8 hours, 600-800 mg every 6-8 hours to the emergency room. drink plenty of fluids.  Your COVID test and strep test were negative.

## 2023-08-04 NOTE — ED Notes (Signed)
Pt. Reports waking this morning after having cold sweats and congestion.... He feels bad.  Pt. Reports he has had some nausea and was unable to go to work and still feels very sick.

## 2023-08-04 NOTE — ED Provider Notes (Signed)
[place] [facility] Provider Note   CSN: [id number] Arrival date & time: 08/04/23  1835     History  Chief Complaint  Patient presents with   Headache   Sore Throat    Chad Long is a 32 y.o. male.  32 year old male presents today for concern of URI symptoms of 1 day duration.  He endorses headache, sore throat, bodyaches.  Denies any known sick contacts but states he has been around his kids endorses decreased p.o. intake today.  The history is provided by the patient. No language interpreter was used.       Home Medications Prior to Admission medications   Medication Sig Start Date End Date Taking? Authorizing Provider  doxycycline (VIBRA-TABS) 100 MG tablet Take 1 tablet (100 mg total) by mouth 2 (two) times daily. 11/02/21   Saguier, Ramon Dredge, PA-C      Allergies    Patient has no known allergies.    Review of Systems   Review of Systems  Constitutional:  Positive for chills. Negative for fever.  HENT:  Positive for sore throat. Negative for drooling.   Eyes:  Negative for visual disturbance.  Respiratory:  Positive for cough. Negative for shortness of breath.   Cardiovascular:  Negative for chest pain and palpitations.  Gastrointestinal:  Negative for abdominal pain, nausea and vomiting.  Neurological:  Positive for headaches. Negative for light-headedness.  All other systems reviewed and are negative.   Physical Exam Updated Vital Signs BP 132/81 (BP Location: Left Arm)   Pulse (!) 107   Temp 99.6 F (37.6 C) (Oral)   Resp 20   Ht 5\' 11"  (1.803 m)   Wt 113.4 kg   SpO2 97%   BMI 34.87 kg/m  Physical Exam Vitals and nursing note reviewed.  Constitutional:      General: He is not in acute distress.    Appearance: Normal appearance. He is not ill-appearing.  HENT:     Head: Normocephalic and atraumatic.     Nose: Nose normal.     Mouth/Throat:     Mouth: Mucous membranes are moist.     Pharynx: No  oropharyngeal exudate or posterior oropharyngeal erythema.  Eyes:     Conjunctiva/sclera: Conjunctivae normal.  Cardiovascular:     Rate and Rhythm: Normal rate and regular rhythm.     Heart sounds: Normal heart sounds.  Pulmonary:     Effort: Pulmonary effort is normal. No respiratory distress.     Breath sounds: Normal breath sounds. No wheezing.  Abdominal:     General: There is no distension.     Palpations: Abdomen is soft.     Tenderness: There is no abdominal tenderness. There is no guarding.  Musculoskeletal:        General: No deformity. Normal range of motion.     Cervical back: Normal range of motion.  Skin:    Findings: No rash.  Neurological:     Mental Status: He is alert.     ED Results / Procedures / Treatments   Labs (all labs ordered are listed, but only abnormal results are displayed) Labs Reviewed  SARS CORONAVIRUS 2 BY RT PCR  GROUP A STREP BY PCR    EKG None  Radiology No results found.  Procedures Procedures    Medications Ordered in ED Medications  acetaminophen (TYLENOL) tablet 1,000 mg (has no administration in time range)  ondansetron (ZOFRAN-ODT) disintegrating tablet 8 mg (has no administration in time range)  ED Course/ Medical Decision Making/ A&P                                 Medical Decision Making Risk OTC drugs. Prescription drug management.   32 year old male presents today for concern of URI symptoms of 1 day duration.  Endorses headache, sore throat, body aches, difficulty with p.o. intake.  He is concerned about dehydration.  Offered Tylenol Zofran, and p.o. fluids.  He agreed.  On reevaluation no significant improvement.  Fluids, IV meds ordered.  Will reevaluate.  Symptoms resolved.  He is appropriate for discharge.  Discharged in stable condition. Symptomatic management discussed COVID test negative.  Strep test negative.  Hemodynamically stable. Final Clinical Impression(s) / ED Diagnoses Final  diagnoses:  Viral URI with cough    Rx / DC Orders ED Discharge Orders     None         Marita [place], PA-C 08/04/23 2329    Lonell Grandchild, MD 08/05/23 1400

## 2023-08-04 NOTE — ED Triage Notes (Signed)
The patient is having headache and cold sweats for one days. No cough. He has a sore throat.
